# Patient Record
Sex: Male | Born: 1975 | Race: Asian | Hispanic: No | Marital: Married | Smoking: Never smoker
Health system: Southern US, Community
[De-identification: ages and names within clinical notes are randomized; demographics above are authoritative.]

---

## 2012-09-16 ENCOUNTER — Ambulatory Visit: Payer: Managed Care, Other (non HMO) | Admitting: Family Medicine

## 2012-09-16 VITALS — BP 90/72 | HR 74 | Temp 97.5°F | Resp 16 | Ht 65.0 in | Wt 150.0 lb

## 2012-09-16 DIAGNOSIS — R112 Nausea with vomiting, unspecified: Secondary | ICD-10-CM

## 2012-09-16 DIAGNOSIS — E860 Dehydration: Secondary | ICD-10-CM

## 2012-09-16 DIAGNOSIS — R197 Diarrhea, unspecified: Secondary | ICD-10-CM

## 2012-09-16 LAB — POCT CBC
Granulocyte percent: 92.8 %G — AB (ref 37–80)
HCT, POC: 48.1 % (ref 43.5–53.7)
Hemoglobin: 15.5 g/dL (ref 14.1–18.1)
Lymph, poc: 0.5 — AB (ref 0.6–3.4)
MCH, POC: 27.3 pg (ref 27–31.2)
MCHC: 32.2 g/dL (ref 31.8–35.4)
MCV: 84.7 fL (ref 80–97)
MID (cbc): 0.5 (ref 0–0.9)
MPV: 9.7 fL (ref 0–99.8)
POC Granulocyte: 12.9 — AB (ref 2–6.9)
POC LYMPH PERCENT: 3.6 %L — AB (ref 10–50)
POC MID %: 3.6 %M (ref 0–12)
Platelet Count, POC: 260 10*3/uL (ref 142–424)
RBC: 5.68 M/uL (ref 4.69–6.13)
RDW, POC: 14.1 %
WBC: 13.9 10*3/uL — AB (ref 4.6–10.2)

## 2012-09-16 MED ORDER — ONDANSETRON 4 MG PO TBDP
4.0000 mg | ORAL_TABLET | Freq: Once | ORAL | Status: AC
Start: 2012-09-16 — End: 2012-09-16
  Administered 2012-09-16: 4 mg via ORAL

## 2012-09-16 MED ORDER — PROMETHAZINE HCL 12.5 MG PO TABS: 12.5000 mg | ORAL_TABLET | Freq: Three times a day (TID) | ORAL | Status: DC | PRN

## 2012-09-16 NOTE — Patient Instructions (Signed)
Vim D? Dy Ru?t Do Vi Rt  (Viral Gastroenteritis) Vim d? dy ru?t do vi rt cn ???c g?i l cm d? dy. Tnh tr?ng ny ?nh h??ng ??n d? dy v ???ng ru?t. N c th? gy tiu ch?y v nn m?a ??t ng?t. B?nh th??ng ko di t? 3 ??n 8 ngy. H?u h?t m?i ng??i pht tri?n ph?n ?ng mi?n d?ch m cu?i cng thot kh?i vi rt. Trong khi ph?n ?ng t? nhin ny pht tri?n, vi rt c th? lm cho b?n r?t m?t.  NGUYN NHN  Nhi?u vi rt khc nhau c th? gy ra vim d? dy ru?t, ch?ng h?n nh? rotavirus ho?c norovirus. B?n c th? b? nhi?m m?t trong nh?ng lo?i vi rt ny do tiu th? th?c ph?m ho?c n??c b? nhi?m b?n. B?n c?ng c th? b? nhi?m vi rt do dng chung d?ng c? ho?c v?t d?ng c nhn khc v?i ng??i b? b?nh ho?c do ch?m vo b? m?t b? nhi?m b?n.  TRI?U CH?NG  Cc tri?u ch?ng ph? bi?n nh?t l tiu ch?y v nn m?a. Nh?ng v?n ?? ny c th? khi?n cho c? th? m?t n??c nghim tr?ng v c? th? m?t cn b?ng mu?i (ch?t ?i?n phn). Cc tri?u ch?ng khc c th? bao g?m:   S?t.  ?au ??u.  M?t m?i.  ?au b?ng. CH?N ?ON  Chuyn gia ch?m [place] y t? th??ng c th? ch?n ?on vim d? dy ru?t do vi rt d?a vo cc tri?u ch?ng v khm tr?c ti?p. M?u phn c?ng c th? ???c l?y ?? xt nghi?m xem c s? hi?n di?n c?a vi rt ho?c nhi?m trng khc khng.  ?I?U TR?  B?nh ny th??ng t? kh?i. ?i?u tr? nh?m m?c ?ch b n??c. Cc tr??ng h?p nghim tr?ng nh?t c?a vim d? dy ru?t do vi rt lin quan ??n nn m?a tr?m tr?ng khi?n b?n khng th? gi? ???c n??c trong c? th?. Trong nh?ng tr??ng h?p ny, ch?t l?ng ph?i ???c ??a vo c? th? thng qua m?t ???ng truy?n t?nh m?ch (IV).  H??NG D?N CH?M Ray T?I NH   U?ng ?? n??c ?? gi? cho n??c ti?u trong ho?c vng nh?t. U?ng m?t l??ng nh? ch?t l?ng th??ng xuyn v t?ng ln theo kh? n?ng dung n?p.  H?i chuyn gia ch?m [place] y t? ?? ???c h??ng d?n b n??c c? th?.  Trnh:  Th?c ph?m c hm l??ng ???ng cao.  R??u.  ?? u?ng c ga.  Thu?c l.  N??c p tri cy.  ?? u?ng c caffeine.  Ch?t l?ng c?c  nng ho?c l?nh.  Th?c ph?m bo, c nhi?u d?u.  ?n ho?c u?ng b?t c? th? g qu nhi?u m?t lc.  S?n ph?m t? s?a cho ??n 24 - 48 gi? sau khi d?ng tiu ch?y.  B?n c th? tiu th? cc ch? ph?m sinh h?c. Probiotics l s? nui c?y vi khu?n c l?i ch? ??ng. Chng c th? lm gi?m b?t l??ng phn v s? l?n tiu ch?y ? ng??i l?n. Probiotics c th? ???c tm th?y trong s?a chua v?i nh?ng nui c?y ch? ??ng v cc ch?t b? sung.  R?a tay k? ?? trnh ly lan vi rt.  Ch? s? d?ng thu?c mua tr?c ti?p t?i hi?u thu?c ho?c thu?c theo toa ?? gi?m ?au, gi?m s? kh ch?u ho?c h? s?t theo ch? d?n c?a chuyn gia ch?m [place] y t? c?a b?n. Khng cho tr? em s? d?ng atpirin. Khng nn dng thu?c ch?ng tiu ch?y.  Hy h?i chuyn gia ch?m [place]  y t? c?a b?n xem c nn ti?p t?c u?ng thu?c ???c k ??n thng th??ng ho?c thu?c mua tr?c ti?p t?i hi?u thu?c c?a b?n khng.  Gi? m?i cu?c h?n khm l?i theo ch? d?n c?a chuyn gia ch?m [place] y t?. HY NGAY L?P T?C THAM V?N V?I CHUYN GIA Y T? N?U:   B?n khng th? gi? ch?t l?ng trong ng??i.  B?n khng ?i ti?u t nh?t m?t l?n m?i 6 ??n 8 ti?ng.  B?n pht tri?n th? d?c.  B?n th?y c mu trong phn ho?c ch?t nn. Phn ho?c ch?t nn c th? trng nh? b c ph.  B?n b? ?au b?ng gia t?ng ho?c ?au t?p trung ? m?t vng nh? (c?c b?).  B?n b? nn m?a ho?c tiu ch?y dai d?ng.  B?n b? s?t.  B?nh nhn l tr? d??i 3 thng tu?i v b b? s?t.  B?nh nhn l tr? trn 3 thng tu?i v b b? s?t v c cc tri?u ch?ng ko di.  B?nh nhn l tr? trn 3 thng tu?i v b b? s?t v c cc tri?u ch?ng ??t nhin tr? nn t?i t? h?n.  B?nh nhn l m?t em b v b khng c n??c m?t khi khc. ??M B?O B?N:   Hi?u cc h??ng d?n ny.  S? theo di tnh tr?ng c?a mnh.  S? yu c?u tr? gip ngay l?p t?c n?u b?n c?m th?y khng kh?e ho?c tnh tr?ng tr? nn t?i h?n. Document Released: 08/27/2011 [facility] Patient Information 2013 [place], [place].

## 2012-09-16 NOTE — Progress Notes (Signed)
Subjective: Patient presents with nausea and vomiting that began last night. Patient stated he vomited throughout the night until 5 a.m.. No other medical problems.  Patient builds frames for a living. No history of ulcers.   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  He tried to go to work today, but was too weak and came home.  Wife brought patient in for evaluation  Objective  Pale, appears weak but is alert and cooperative with good eye contact BP 94/74 P 100 sitting BP 90/72 P 120 standing HEENT:  Unremarkable Chest:  Clear Heart: rapid without murmur or gallop Abdomen:  Active BS, soft, nontender with no HSM or mass Skin:  Some petechia on abdomen  Assessment:  Acute dehydration with gastroenteritis Results for orders placed in visit on 09/16/12  POCT CBC      Result Value Range   WBC 13.9 (*) 4.6 - 10.2 K/uL   Lymph, poc 0.5 (*) 0.6 - 3.4   POC LYMPH PERCENT 3.6 (*) 10 - 50 %L   MID (cbc) 0.5  0 - 0.9   POC MID % 3.6  0 - 12 %M   POC Granulocyte 12.9 (*) 2 - 6.9   Granulocyte percent 92.8 (*) 37 - 80 %G   RBC 5.68  4.69 - 6.13 M/uL   Hemoglobin 15.5  14.1 - 18.1 g/dL   HCT, POC 45.4  09.8 - 53.7 %   MCV 84.7  80 - 97 fL   MCH, POC 27.3  27 - 31.2 pg   MCHC 32.2  31.8 - 35.4 g/dL   RDW, POC 11.9     Platelet Count, POC 260  142 - 424 K/uL   MPV 9.7  0 - 99.8 fL    Plan:  IV fluids Nausea with vomiting - Plan: promethazine (PHENERGAN) 12.5 MG tablet, POCT CBC, ondansetron (ZOFRAN-ODT) disintegrating tablet 4 mg  Diarrhea - Plan: promethazine (PHENERGAN) 12.5 MG tablet, POCT CBC  Dehydration - Plan: promethazine (PHENERGAN) 12.5 MG tablet, POCT CBC

## 2015-01-19 ENCOUNTER — Other Ambulatory Visit (HOSPITAL_COMMUNITY): Payer: Self-pay

## 2015-01-19 ENCOUNTER — Telehealth: Payer: Self-pay

## 2015-01-19 ENCOUNTER — Emergency Department (HOSPITAL_COMMUNITY): Admission: EM | Admit: 2015-01-19 | Discharge: 2015-01-19 | Discharge status: Absent without leave | Payer: Self-pay

## 2015-01-19 ENCOUNTER — Ambulatory Visit (INDEPENDENT_AMBULATORY_CARE_PROVIDER_SITE_OTHER): Payer: Managed Care, Other (non HMO) | Admitting: Emergency Medicine

## 2015-01-19 ENCOUNTER — Ambulatory Visit (HOSPITAL_COMMUNITY)
Admission: RE | Admit: 2015-01-19 | Discharge: 2015-01-19 | Disposition: A | Discharge status: Home / Self Care | Payer: Managed Care, Other (non HMO) | Source: Ambulatory Visit | Attending: Emergency Medicine | Admitting: Emergency Medicine

## 2015-01-19 VITALS — BP 171/93 | HR 75 | Temp 97.4°F | Resp 18 | Ht 66.0 in | Wt 161.0 lb

## 2015-01-19 DIAGNOSIS — R51 Headache: Secondary | ICD-10-CM | POA: Diagnosis not present

## 2015-01-19 DIAGNOSIS — R519 Headache, unspecified: Secondary | ICD-10-CM

## 2015-01-19 DIAGNOSIS — R112 Nausea with vomiting, unspecified: Secondary | ICD-10-CM

## 2015-01-19 DIAGNOSIS — H53149 Visual discomfort, unspecified: Secondary | ICD-10-CM | POA: Insufficient documentation

## 2015-01-19 DIAGNOSIS — R42 Dizziness and giddiness: Secondary | ICD-10-CM

## 2015-01-19 DIAGNOSIS — R1110 Vomiting, unspecified: Secondary | ICD-10-CM | POA: Insufficient documentation

## 2015-01-19 LAB — POCT CBC
Granulocyte percent: 87.3 %G — AB (ref 37–80)
HEMATOCRIT: 44.8 % (ref 43.5–53.7)
Hemoglobin: 14.3 g/dL (ref 14.1–18.1)
Lymph, poc: 1.4 (ref 0.6–3.4)
MCH: 26 pg — AB (ref 27–31.2)
MCHC: 31.9 g/dL (ref 31.8–35.4)
MCV: 81.5 fL (ref 80–97)
MID (CBC): 0.7 (ref 0–0.9)
MPV: 7.6 fL (ref 0–99.8)
POC GRANULOCYTE: 14.5 — AB (ref 2–6.9)
POC LYMPH %: 8.6 % — AB (ref 10–50)
POC MID %: 4.1 % (ref 0–12)
Platelet Count, POC: 240 10*3/uL (ref 142–424)
RBC: 5.5 M/uL (ref 4.69–6.13)
RDW, POC: 14.4 %
WBC: 16.6 10*3/uL — AB (ref 4.6–10.2)

## 2015-01-19 LAB — POCT URINALYSIS DIPSTICK
BILIRUBIN UA: NEGATIVE
Glucose, UA: NEGATIVE
Ketones, UA: 15
LEUKOCYTES UA: NEGATIVE
NITRITE UA: NEGATIVE
RBC UA: NEGATIVE
SPEC GRAV UA: 1.025
Urobilinogen, UA: 0.2
pH, UA: 6.5

## 2015-01-19 LAB — POCT UA - MICROSCOPIC ONLY
CRYSTALS, UR, HPF, POC: NEGATIVE
Casts, Ur, LPF, POC: NEGATIVE
Yeast, UA: NEGATIVE

## 2015-01-19 MED ORDER — HYDROCODONE-ACETAMINOPHEN 5-325 MG PO TABS: 1.0000 | ORAL_TABLET | ORAL | Status: DC | PRN

## 2015-01-19 MED ORDER — ONDANSETRON 8 MG PO TBDP: 8.0000 mg | ORAL_TABLET | Freq: Three times a day (TID) | ORAL | Status: DC | PRN

## 2015-01-19 NOTE — Progress Notes (Signed)
Subjective:  Patient ID: Zachary Carey, male    DOB: Jul 17, 1975  Age: 39 y.o. MRN: [id number]  CC: Emesis; Chills; and Headache   HPI Zachary Carey presents  with sudden onset of a headache today he developed nausea and vomiting several hours ago and shortly after developed dizziness and headache. He describes the headache as worst in his life. He denies any other focal neurologic or visual symptoms. No history of injury or antecedent illness. He was brought by his family as he can't drive. He was forced to leave work due to symptoms. Has no fever or chills no cough coryza sore throat ear pain.  History Zachary Carey has no past medical history on file.   He has no past surgical history on file.   His  family history is not on file.  He   reports that he has never smoked. He does not have any smokeless tobacco history on file. He reports that he does not drink alcohol or use illicit drugs.  Outpatient Prescriptions Prior to Visit  Medication Sig Dispense Refill  . promethazine (PHENERGAN) 12.5 MG tablet Take 1 tablet (12.5 mg total) by mouth every 8 (eight) hours as needed for nausea. 20 tablet 0   No facility-administered medications prior to visit.    History   Social History  . Marital Status: Married    Spouse Name: N/A  . Number of Children: N/A  . Years of Education: N/A   Social History Main Topics  . Smoking status: Never Smoker   . Smokeless tobacco: Not on file  . Alcohol Use: No  . Drug Use: No  . Sexual Activity: Yes   Other Topics Concern  . None   Social History Narrative     Review of Systems  Constitutional: Positive for fever and chills. Negative for appetite change.  HENT: Negative for congestion, ear pain, postnasal drip, sinus pressure and sore throat.   Eyes: Negative for pain and redness.  Respiratory: Negative for cough, shortness of breath and wheezing.   Cardiovascular: Negative for leg swelling.  Gastrointestinal: Positive for nausea and  vomiting. Negative for abdominal pain, diarrhea, constipation and blood in stool.  Endocrine: Negative for polyuria.  Genitourinary: Negative for dysuria, urgency, frequency and flank pain.  Musculoskeletal: Negative for gait problem.  Skin: Negative for rash.  Neurological: Positive for dizziness and headaches. Negative for weakness.  Psychiatric/Behavioral: Negative for confusion and decreased concentration. The patient is not nervous/anxious.     Objective:  BP 171/93 mmHg  Pulse 75  Temp(Src) 97.4 F (36.3 C) (Oral)  Resp 18  Ht 5\' 6"  (1.676 m)  Wt 161 lb (73.029 kg)  BMI 26.00 kg/m2  SpO2 97%  Physical Exam  Constitutional: He is oriented to person, place, and time. He appears well-developed and well-nourished. He appears distressed.  HENT:  Head: Normocephalic and atraumatic.  Right Ear: External ear normal.  Left Ear: External ear normal.  Nose: Nose normal.  Mouth/Throat: Mucous membranes are dry.  Eyes: Conjunctivae and EOM are normal. Pupils are equal, round, and reactive to light. No scleral icterus.  Neck: Normal range of motion. Neck supple. No tracheal deviation present.  Cardiovascular: Normal rate, regular rhythm and normal heart sounds.   Pulmonary/Chest: Effort normal. No respiratory distress. He has no wheezes. He has no rales.  Abdominal: He exhibits no mass. There is no tenderness. There is no rebound and no guarding.  Musculoskeletal: He exhibits no edema.  Lymphadenopathy:    He has no cervical adenopathy.  Neurological: He is alert and oriented to person, place, and time. Coordination normal.  Skin: Skin is warm and dry. No rash noted.  Psychiatric: He has a normal mood and affect. His behavior is normal.      Assessment & Plan:   There are no diagnoses linked to this encounter. I have discontinued Mr. Wirthlin's promethazine.  No orders of the defined types were placed in this encounter.   His CT scan was reported negative by  radiologist.  Appropriate red flag conditions were discussed with the patient as well as actions that should be taken.  Patient expressed his understanding.  Follow-up: No Follow-up on file.  Carmelina Dane, MD

## 2015-01-19 NOTE — Telephone Encounter (Signed)
Left vm for [place] to cb for Rx info. Dr. Dareen Piano wrote an Rx for Norco 5-325 for his HA. Will put Rx in drawer.

## 2015-01-19 NOTE — Telephone Encounter (Signed)
Pt just came back from having a ct which they were told it was negative but now would like a rx for his headache    Best number [phone]

## 2015-01-19 NOTE — Patient Instructions (Signed)
Go to [facility] and register at the Emergency Department. Register as OUTPATIENT CT. DO NOT REGISTER AS ED PATIENT.

## 2015-07-06 ENCOUNTER — Ambulatory Visit (INDEPENDENT_AMBULATORY_CARE_PROVIDER_SITE_OTHER): Payer: Managed Care, Other (non HMO) | Admitting: Family Medicine

## 2015-07-06 VITALS — BP 126/84 | HR 75 | Temp 98.1°F | Resp 18 | Ht 66.0 in | Wt 158.8 lb

## 2015-07-06 DIAGNOSIS — Z131 Encounter for screening for diabetes mellitus: Secondary | ICD-10-CM

## 2015-07-06 DIAGNOSIS — Z13220 Encounter for screening for lipoid disorders: Secondary | ICD-10-CM | POA: Diagnosis not present

## 2015-07-06 DIAGNOSIS — Z23 Encounter for immunization: Secondary | ICD-10-CM

## 2015-07-06 DIAGNOSIS — H547 Unspecified visual loss: Secondary | ICD-10-CM | POA: Diagnosis not present

## 2015-07-06 DIAGNOSIS — Z0000 Encounter for general adult medical examination without abnormal findings: Secondary | ICD-10-CM | POA: Diagnosis not present

## 2015-07-06 LAB — COMPLETE METABOLIC PANEL WITH GFR
ALT: 28 U/L (ref 9–46)
AST: 22 U/L (ref 10–40)
Albumin: 4.4 g/dL (ref 3.6–5.1)
Alkaline Phosphatase: 38 U/L — ABNORMAL LOW (ref 40–115)
BILIRUBIN TOTAL: 0.6 mg/dL (ref 0.2–1.2)
BUN: 12 mg/dL (ref 7–25)
CHLORIDE: 103 mmol/L (ref 98–110)
CO2: 26 mmol/L (ref 20–31)
Calcium: 9.3 mg/dL (ref 8.6–10.3)
Creat: 1.04 mg/dL (ref 0.60–1.35)
GFR, EST NON AFRICAN AMERICAN: 89 mL/min (ref >=60)
Glucose, Bld: 91 mg/dL (ref 65–99)
Potassium: 4.1 mmol/L (ref 3.5–5.3)
Sodium: 138 mmol/L (ref 135–146)
Total Protein: 7.4 g/dL (ref 6.1–8.1)

## 2015-07-06 LAB — LIPID PANEL
Cholesterol: 294 mg/dL — ABNORMAL HIGH (ref 125–200)
HDL: 48 mg/dL (ref >=40)
LDL CALC: 202 mg/dL — AB (ref <130)
TRIGLYCERIDES: 218 mg/dL — AB (ref <150)
Total CHOL/HDL Ratio: 6.1 Ratio — ABNORMAL HIGH (ref <=5.0)
VLDL: 44 mg/dL — ABNORMAL HIGH (ref <30)

## 2015-07-06 NOTE — Progress Notes (Signed)
Subjective:    Patient ID: Zachary Carey, male    DOB: 04-Aug-1975, 40 y.o.   MRN: [id number] By signing my name below, I, Littie Deeds, attest that this documentation has been prepared under the direction and in the presence of Meredith Staggers, MD.  Electronically Signed: Littie Deeds, Medical Scribe. 07/06/2015. 9:40 AM.  HPI HPI Comments: Zachary Carey is a 40 y.o. male who presents to the [facility] for an annual physical exam with biometric screening form. He is a new patient to me, no PCP listed. He does not have any known medical problems and does not take any regular medications. Patient is fasting; he last ate around 10:00 PM last night. No FMHx of medical problems, including cancer, per patient.  Immunizations: He received the flu shot about 3 months ago. Patient is a [place] and came to the [place] in 2002. He states that he has not had any vaccinations other than the flu shot since then.  Depression screening:  Depression screen [facility] 2/9 07/06/2015 01/19/2015  Decreased Interest 0 0  Down, Depressed, Hopeless 0 0  PHQ - 2 Score 0 0   Vision screening: He wears protective glasses when he is at work, but does not wear any prescription lenses. He does not see an eye doctor regularly.  Visual Acuity Screening   Right eye Left eye Both eyes  Without correction:  With correction:       Dentist: He does not see a dentist regularly.  Exercise: No regular exercise.  He is happily married and has three children (18, 3, 9). Patient makes picture frames for a living.  There are no active problems to display for this patient.  History reviewed. No pertinent past medical history. History reviewed. No pertinent past surgical history. No Known Allergies Prior to Admission medications   Medication Sig Start Date End Date Taking? Authorizing Provider  HYDROcodone-acetaminophen (NORCO) 5-325 MG per tablet Take 1-2 tablets by mouth every 4 (four)  hours as needed. Patient not taking: Reported on 07/06/2015 01/19/15   Carmelina Dane, MD  ondansetron (ZOFRAN-ODT) 8 MG disintegrating tablet Take 1 tablet (8 mg total) by mouth every 8 (eight) hours as needed for nausea. Patient not taking: Reported on 07/06/2015 01/19/15   Carmelina Dane, MD   Social History   Social History  . Marital Status: Married    Spouse Name: N/A  . Number of Children: N/A  . Years of Education: N/A   Occupational History  . Not on file.   Social History Main Topics  . Smoking status: Never Smoker   . Smokeless tobacco: Not on file  . Alcohol Use: No  . Drug Use: No  . Sexual Activity: Yes   Other Topics Concern  . Not on file   Social History Narrative     Review of Systems 13 point ROS reviewed on patient health survey. No specific concerns today. Negative other than listed above  Objective:   Physical Exam  Constitutional: He is oriented to person, place, and time. He appears well-developed and well-nourished.  HENT:  Head: Normocephalic and atraumatic.  Right Ear: External ear normal.  Left Ear: External ear normal.  Mouth/Throat: Oropharynx is clear and moist.  Eyes: Conjunctivae and EOM are normal. Pupils are equal, round, and reactive to light.  Neck: Normal range of motion. Neck supple. No thyromegaly present.  Cardiovascular: Normal rate, regular rhythm, normal heart sounds and intact distal pulses.   Pulmonary/Chest:  Effort normal and breath sounds normal. No respiratory distress. He has no wheezes.  Abdominal: Soft. He exhibits no distension. There is no tenderness. Hernia confirmed negative in the right inguinal area and confirmed negative in the left inguinal area.  Musculoskeletal: Normal range of motion. He exhibits no edema or tenderness.  Lymphadenopathy:    He has no cervical adenopathy.  Neurological: He is alert and oriented to person, place, and time. He has normal reflexes.  Skin: Skin is warm and dry.  Psychiatric:  He has a normal mood and affect. His behavior is normal.  Vitals reviewed.     Filed Vitals:   07/06/15 0910  BP: 126/84  Pulse: 75  Temp: 98.1 F (36.7 C)  TempSrc: Oral  Resp: 18  Height:  (1.676 m)  Weight: 158 lb 12.8 oz (72.031 kg)  SpO2: 98%       Assessment & Plan:  Zachary Carey is a 40 y.o. male Annual physical exam  -anticipatory guidance as below in AVS, screening labs above. Health maintenance items as above in HPI discussed/recommended as applicable.   Screening for diabetes mellitus - Plan: COMPLETE METABOLIC PANEL WITH GFR  Need for Tdap vaccination - Plan: Tdap vaccine greater than or equal to 7yo IM given  Screening for hyperlipidemia - Plan: Lipid panel  Decreased visual acuity - Plan: Ambulatory referral to Ophthalmology for decreased R eye vision.   Understanding of above expressed. Form for work started, but will need lab results to complete.    No orders of the defined types were placed in this encounter.   Patient Instructions  You should receive a call or letter about your lab results within the next week to 10 days.   Tetanus given today.   We will refer you to eye doctor (right eye does not see well).   Keeping you healthy  Get these tests  Blood pressure- Have your blood pressure checked once a year by your healthcare provider.  Normal blood pressure is 120/80.  Weight- Have your body mass index (BMI) calculated to screen for obesity.  BMI is a measure of body fat based on height and weight. You can also calculate your own BMI at https://www.west-esparza.com/.  Cholesterol- Have your cholesterol checked regularly starting at age 57, sooner may be necessary if you have diabetes, high blood pressure, if a family member developed heart diseases at an early age or if you smoke.   Chlamydia, HIV, and other sexual transmitted disease- Get screened each year until the age of 91 then within three months of each new sexual partner.  Diabetes-  Have your blood sugar checked regularly if you have high blood pressure, high cholesterol, a family history of diabetes or if you are overweight.  Get these vaccines  Flu shot- Every fall.  Tetanus shot- Every 10 years.  Menactra- Single dose; prevents meningitis.  Take these steps  Don't smoke- If you do smoke, ask your healthcare provider about quitting. For tips on how to quit, go to www.smokefree.gov or call 1-800-QUIT-NOW.  Be physically active- Exercise 5 days a week for at least 30 minutes.  If you are not already physically active start slow and gradually work up to 30 minutes of moderate physical activity.  Examples of moderate activity include walking briskly, mowing the yard, dancing, swimming bicycling, etc.  Eat a healthy diet- Eat a variety of healthy foods such as fruits, vegetables, low fat milk, low fat cheese, yogurt, lean meats, poultry, fish, beans, tofu, etc.  For more information on healthy eating, go to www.thenutritionsource.org  Drink alcohol in moderation- Limit alcohol intake two drinks or less a day.  Never drink and drive.  Dentist- Brush and floss teeth twice daily; visit your dentis twice a year.  Depression-Your emotional health is as important as your physical health.  If you're feeling down, losing interest in things you normally enjoy please talk with your healthcare provider.  Gun Safety- If you keep a gun in your home, keep it unloaded and with the safety lock on.  Bullets should be stored separately.  Helmet use- Always wear a helmet when riding a motorcycle, bicycle, rollerblading or skateboarding.  Safe sex- If you may be exposed to a sexually transmitted infection, use a condom  Seat belts- Seat bels can save your life; always wear one.  Smoke/Carbon Monoxide detectors- These detectors need to be installed on the appropriate level of your home.  Replace batteries at least once a year.  Skin Cancer- When out in the sun, cover up and use  sunscreen SPF 15 or higher.  Violence- If anyone is threatening or hurting you, please tell your healthcare provider.    I personally performed the services described in this documentation, which was scribed in my presence. The recorded information has been reviewed and considered, and addended by me as needed.

## 2015-07-06 NOTE — Patient Instructions (Signed)
You should receive a call or letter about your lab results within the next week to 10 days.   Tetanus given today.   We will refer you to eye doctor (right eye does not see well).   Keeping you healthy  Get these tests  Blood pressure- Have your blood pressure checked once a year by your healthcare provider.  Normal blood pressure is 120/80.  Weight- Have your body mass index (BMI) calculated to screen for obesity.  BMI is a measure of body fat based on height and weight. You can also calculate your own BMI at https://www.west-esparza.com/.  Cholesterol- Have your cholesterol checked regularly starting at age 48, sooner may be necessary if you have diabetes, high blood pressure, if a family member developed heart diseases at an early age or if you smoke.   Chlamydia, HIV, and other sexual transmitted disease- Get screened each year until the age of 49 then within three months of each new sexual partner.  Diabetes- Have your blood sugar checked regularly if you have high blood pressure, high cholesterol, a family history of diabetes or if you are overweight.  Get these vaccines  Flu shot- Every fall.  Tetanus shot- Every 10 years.  Menactra- Single dose; prevents meningitis.  Take these steps  Don't smoke- If you do smoke, ask your healthcare provider about quitting. For tips on how to quit, go to www.smokefree.gov or call 1-800-QUIT-NOW.  Be physically active- Exercise 5 days a week for at least 30 minutes.  If you are not already physically active start slow and gradually work up to 30 minutes of moderate physical activity.  Examples of moderate activity include walking briskly, mowing the yard, dancing, swimming bicycling, etc.  Eat a healthy diet- Eat a variety of healthy foods such as fruits, vegetables, low fat milk, low fat cheese, yogurt, lean meats, poultry, fish, beans, tofu, etc.  For more information on healthy eating, go to www.thenutritionsource.org  Drink alcohol in  moderation- Limit alcohol intake two drinks or less a day.  Never drink and drive.  Dentist- Brush and floss teeth twice daily; visit your dentis twice a year.  Depression-Your emotional health is as important as your physical health.  If you're feeling down, losing interest in things you normally enjoy please talk with your healthcare provider.  Gun Safety- If you keep a gun in your home, keep it unloaded and with the safety lock on.  Bullets should be stored separately.  Helmet use- Always wear a helmet when riding a motorcycle, bicycle, rollerblading or skateboarding.  Safe sex- If you may be exposed to a sexually transmitted infection, use a condom  Seat belts- Seat bels can save your life; always wear one.  Smoke/Carbon Monoxide detectors- These detectors need to be installed on the appropriate level of your home.  Replace batteries at least once a year.  Skin Cancer- When out in the sun, cover up and use sunscreen SPF 15 or higher.  Violence- If anyone is threatening or hurting you, please tell your healthcare provider.

## 2016-09-19 ENCOUNTER — Ambulatory Visit (INDEPENDENT_AMBULATORY_CARE_PROVIDER_SITE_OTHER): Payer: Managed Care, Other (non HMO) | Admitting: Physician Assistant

## 2016-09-19 VITALS — BP 126/80 | HR 69 | Temp 98.3°F | Resp 16 | Ht 65.0 in | Wt 156.4 lb

## 2016-09-19 DIAGNOSIS — Z131 Encounter for screening for diabetes mellitus: Secondary | ICD-10-CM | POA: Diagnosis not present

## 2016-09-19 DIAGNOSIS — Z0000 Encounter for general adult medical examination without abnormal findings: Secondary | ICD-10-CM | POA: Diagnosis not present

## 2016-09-19 DIAGNOSIS — E785 Hyperlipidemia, unspecified: Secondary | ICD-10-CM

## 2016-09-19 DIAGNOSIS — Z0189 Encounter for other specified special examinations: Secondary | ICD-10-CM

## 2016-09-19 DIAGNOSIS — H547 Unspecified visual loss: Secondary | ICD-10-CM

## 2016-09-19 DIAGNOSIS — Z13220 Encounter for screening for lipoid disorders: Secondary | ICD-10-CM | POA: Diagnosis not present

## 2016-09-19 DIAGNOSIS — R030 Elevated blood-pressure reading, without diagnosis of hypertension: Secondary | ICD-10-CM | POA: Diagnosis not present

## 2016-09-19 DIAGNOSIS — Z008 Encounter for other general examination: Secondary | ICD-10-CM

## 2016-09-19 NOTE — Patient Instructions (Addendum)
Last year your cholesterol level was high. Please see below information about cholesterol. You can help correct this through the foods you eat and getting regular exercise.  If your labs are still high this year, we will start you on a medication to lower it.  We will call you when your paperwork is ready.   The eye doctor will call you to set up an appointment to check your vision. Please make the appointment, as your vision is getting worse.   Thank you for coming in today. I hope you feel we met your needs.  Feel free to call [facility] if you have any questions or further requests.  Please consider signing up for MyChart if you do not already have it, as this is a great way to communicate with me.  Best,  Whitney McVey, PA-C    Cholesterol Cholesterol is a fat. Your body needs a small amount of cholesterol. Cholesterol (plaque) may build up in your blood vessels (arteries). That makes you more likely to have a heart attack or stroke. You cannot feel your cholesterol level. Having a blood test is the only way to find out if your level is high. Keep your test results. Work with your doctor to keep your cholesterol at a good level. What do the results mean?  Total cholesterol is how much cholesterol is in your blood.  LDL is bad cholesterol. This is the type that can build up. Try to have low LDL.  HDL is good cholesterol. It cleans your blood vessels and carries LDL away. Try to have high HDL.  Triglycerides are fat that the body can store or burn for energy. What are good levels of cholesterol?  Total cholesterol below 200.  LDL below 100 is good for people who have health risks. LDL below 70 is good for people who have very high risks.  HDL above 40 is good. It is best to have HDL of 60 or higher.  Triglycerides below 150. How can I lower my cholesterol? Diet  Follow your diet program as told by your doctor.  Choose fish, white meat chicken, or [place] that is roasted or baked.  Try not to eat red meat, fried foods, sausage, or lunch meats.  Eat lots of fresh fruits and vegetables.  Choose whole grains, beans, pasta, potatoes, and cereals.  Choose olive oil, corn oil, or canola oil. Only use small amounts.  Try not to eat butter, mayonnaise, shortening, or palm kernel oils.  Try not to eat foods with trans fats.  Choose low-fat or nonfat dairy foods.  Drink skim or nonfat milk.  Eat low-fat or nonfat yogurt and cheeses.  Try not to drink whole milk or cream.  Try not to eat ice cream, egg yolks, or full-fat cheeses.  Healthy desserts include angel food cake, ginger snaps, animal crackers, hard candy, popsicles, and low-fat or nonfat frozen yogurt. Try not to eat pastries, cakes, pies, and cookies. Exercise  Follow your exercise program as told by your doctor.  Be more active. Try gardening, walking, and taking the stairs.  Ask your doctor about ways that you can be more active. Medicine  Take over-the-counter and prescription medicines only as told by your doctor. This information is not intended to replace advice given to you by your health care provider. Make sure you discuss any questions you have with your health care provider. Document Released: 08/31/2008 Document Revised: 01/04/2016 Document Reviewed: 12/15/2015 Elsevier Interactive Patient Education  2017 [place]? ?? ?  n h?n ch? ch?t be?o va? cholesterol Fat and Cholesterol Restricted Diet N?ng ?? ch?t bo v cholesterol cao trong mu cu?a quy? vi? c th? d?n ??n cc v?n ?? s?c kh?e khc nhau, ch?ng h?n nh? cc b?nh v? tim, m?ch mu, ti m?t, gan v tuy?n t?y. Ch?t bo l ca?c ngu?n n?ng l??ng t?p trung t?n t?i ? nhi?u d?ng khc nhau. M?t s? lo?i ch?t bo nh?t ??nh, bao g?m ch?t bo bo ha, c th? gy h?i khi th??a. Cholesterol l m?t ch?t m c? th? c?n ??n v?i m?t l??ng nh?. C? th? c?a quy? vi? ta?o ra t?t c? cholesterol c?n thi?t. Cholesterol d? th?a do th?c ph?m quy? vi? ?n. Khi  quy? vi? c n?ng ?? cholesterol v ch?t bo bo ha cao trong mu, cc v?n ?? s?c kh?e c th? pht sinh v ch?t bo v cholesterol d? th??a s? ti?ch tu? d?c theo tha?nh cc m?ch mu, khi?n cc m?ch mu ? bi? he?p la?i. L?a ch?n ?ng lo?i th?c ph?m s? gip quy? vi? ki?m sot l??ng ch?t bo v cholesterol ?n va?o. ?i?u ny s? gip gi? cho n?ng ?? cc ch?t na?y trong mu c?a quy? vi? n?m trong gi?i h?n bnh th??ng v lm gi?m nguy c? m?c b?nh. K? ho?ch c?a ti l g? Chuyn gia ch?m sc s?c kh?e c th? khuy?n ngh? quy? vi?:  H?n ch? l??ng ch?t bo tiu th? ?? m??c t? ______% tr? xu?ng theo t?ng l??ng calo m?i ngy.  H?n ch? l??ng cholesterol trong ch? ?? ?n c?a quy? vi? ?? m??c d??i _________mg m?i ngy.  ?n t? 20-30 gam ch?t x? m?i ngy. Ti nn ch?n nh?ng lo?i ch?t bo no?  Ch?n cc ch?t bo t?t cho s?c kh?e th??ng xuyn h?n. Ch?n ch?t bo khng bo ha ??n v ch?t be?o khng ba?o ho?a ?a, ch?ng h?n nh? d?u  liu v d?u canola, h?t lanh, qu? c ch, h?nh nhn v cc lo?i h?t.  ?n thm ch?t bo omega-3. Nh??ng l?a ch?n h??p ly? bao g?m c h?i, c thu, c mi, c ng?, d?u h?t lanh va? h?t lanh nghi?n. ???t mu?c tiu ?n c t nh?t hai l?n m?t tu?n.  H?n ch? cc ch?t bo bo ha. Ch?t bo bo ha ch? y?u ???c tm th?y trong cc s?n ph?m ??ng v?t, nh? th?t, b? v kem. Ca?c ngu?n ch?t bo bo ha t?? th??c v?t bao g?m d?u c?, d?u h?t c? v d?u d?a.  Trnh cc th?c ph?m co? ch??a cc lo?i d?u hydro ha m?t ph?n. Nh?ng th?c ph?m ny ch?a ch?t bo chuy?n ha. V d? v? th?c ph?m ch?a ch?t bo chuy?n ho?a l b? th?c v?t, m?t s? b? th?c v?t ?o?ng h?p, bnh quy, bnh quy gin v bnh n??ng khc. Nh?ng nguyn t?c chung ti c?n tun theo l g? Nh?ng h??ng d?n cho vi?c ?n u?ng lnh m?nh na?y s? gip quy? vi? ki?m sot l??ng ch?t be?o v cholesterol ?n va?o:  Ki?m tra nhn th?c ph?m c?n th?n ?? nh?n bi?t th?c ph?m c ch?a ch?t bo chuy?n ha ho?c c hm l??ng ch?t bo bo ha cao.  Cho rau v sa lt rau  xanh vo m?t n?a ??a ??ng th?c ?n.  Cho ngu? c?c nguyn ca?m va?o m?t ph?n t? ??a. Hy tm t?? "whole" (nguyn ca?m) l t? ??u tin trong danh sch thnh ph?n th?c ?n.  Cho th?c ?n c protein thi?t na?c va?o m?t ph?n t? ??a.  H?n ch? tri cy ?? m??c hai   b?a m?t ngy. Ch?n tri cy thay v n??c tri cy.  ?n nhi?u th?c ph?m c ch?a ch?t x? nh? to, bng c?i xanh, c r?t, ??u, ??u H-Lan v ??i m?ch.  ?n nhi?u th?c ?n n?u t?i nh v i?t th??c ?n ?? nh hng, th??c ?n t? ch?n v th?c ?n nhanh.  H?n ch? ho?c trnh u?ng r??u.  Ha?n ch? th?c ph?m co? nhi?u tinh b?t v ???ng.  H?n ch? th?c ph?m chin.  N?u ?n b?ng cc ph??ng php khc thay vi? chin. Bo? lo?, lu?c, n??ng v hun ??u l nh??ng l?a ch?n tuy?t v?i.  Gi?m cn n?u qu v? th?a cn. Gia?m ch? 5-10% cn n?ng c? th? ban ??u c th? gip i?ch cho s?c kh?e t?ng th? c?a quy? vi? v ng?n ng?a cc b?nh nh? ti?u ???ng v b?nh tim. Ti c th? ?n nh?ng th?c ph?m no? Ng? c?c nguyn h?t   Ng? c?c nguyn ca?m nh? la m nguyn ca?m ho??c ho?c bnh m nguyn ca?m, bnh quy gin, ng? c?c v m ?ng. B?t y?n m?ch khng ???ng, mi? bulgur, la m?ch, dim ma?ch (quinoa) ho?c g?o l??t. Ng ho?c bnh b?t m nguyn ca?m. Marlou Starks c?   Marlou Starks c? t??i ho?c ?ng l?nh (ch?a ch? bi?n, h?p, bo? lo? ho?c n??ng). Toney Reil tr?n. Tri cy   T?t c? cc lo?i tra?i cy t??i, ?ng h?p (d???i da?ng n??c p t? nhin) ho?c ?ng l?nh. Th?t v nh?ng ngu?n th?c ph?m ch?a protein khc   Th?t b xay (85% ho?c na?c h?n), thi?t bo? ?n c? ho?c th?t b c?t bo? m??. Ga? ho??c ga? ty bo? da. G ho?c g ty xay. Th?t l??n c??t bo? m??. T?t c? c v h?i s?n. Tr?ng. Cc lo?i ??u, ??u H Lan ho??c ??u l?ng s?y kh. Cc lo?i qu? h?ch v h?t khng ??p mu?i. ??u ?ng h?p ho??c ??u s?y kh khng ??p mu?i. B? s?a   Cc s?n ph?m t? s?a t bo nh? s?a g?y ho?c s?a 1%, 2% ho?c pho mt t bo, ricotta i?t bo ho?c pho mt t??i ho?c s?a chua t bo. M? v d?u   B? th?c v?t ?ng h?p  khng c ch?t bo chuy?n ha. Mayonnaise v n???c tr?n rau tr?n nh? ho??c i?t ch?t be?o. Qu? b?. D?u  liu, d?u canola, d?u m ho??c d?u hoa rum. B? ??u ph?ng ho?c h?nh nhn thin nhin (ch?n nh?ng loa?i khng co? thm ???ng v d?u). Nh?ng th?c ph?m li?t k ? trn c th? khng ph?i l m?t danh m?c ??y ?? cc th?c ph?m v ?? u?ng ???c khuy?n ngh?Augustin Coupe h? v?i chuyn gia dinh d??ng ?? c thm s? l?a ch?n.  Cc th?c ph?m c?n trnh Ng? c?c nguyn h?t   Bnh m tr?ng. M tr?ng. G?o tr?ng. Bnh m ng. Bnh m trn, bnh ng?t v bnh s?ng b. Bnh quy gin ch?a ch?t bo chuy?n ha. Marlou Starks c?   Khoai ty tr?ng. Howie Ill tr?n kem ho??c rau xa?o. Cc lo?i rau tr?n n??c s?t pho mt. Tri cy   Tri cy s?y kh. Tri cy ?ng h?p ngm xi-r loa?ng ho??c ???c. N??c p tri cy. Th?t v nh?ng ngu?n th?c ph?m ch?a protein khc   La?t thi?t m??. X??ng s??n, cnh g, th?t xng khi, xc xch, xc xch hun khi, xc xch Y?, lo?ng l??n, m? l?ng l??n, xc xch hot dog, xc xi?ch ?? ra?n v th?t ?n tr?a ?ng gi. Gan v thi?t n?i ta?ng. B? s?a   S??a  nguyn kem ho??c 2%, kem, h?n h??p s??a nguyn kem va? kem t??i v pho mt kem. Pho mt s?a nguyn kem. S??a chua nguyn ch?t bo ho?c s?a chua co? ????ng. Pho mt nguyn ch?t bo. B?t kem khng s??a v l??p phu? kem ?a? ?a?nh bng. Pho mt ch? bi?n, ph?t pho mt, ho??c s??a ?ng pho mt. ?? u?ng   R??u. ?? u?ng c ???ng (nh? soda, n??c chanh v n??c tri cy ho?c r???u pn). M? v d?u   B?, b? th?c v?t da?ng tho?i, m? l?n, m?? pha ba?nh, b? s?a tru l?ng ho?c ch?t bo th?t xng khi. D?u d?a, h?t c?, ho?c c?. K?o v ?? trng mi?ng   Xi-r ng, ???ng, m?t ong v m?t ????ng. K?o. M??t v th?ch. Xi r. Ngu? c?c co? ????ng. Ba?nh quy, bnh n??ng, bnh ng?t, bnh rn, bnh n??ng x?p v kem. Nh?ng th?c ph?m li?t k ? trn c th? khng ph?i l m?t danh m?c ??y ?? cc th?c ph?m v ?? u?ng c?n trnh. Lin h? v?i chuyn gia dinh d??ng ?? c thm thng tin.  Thng  tin ny khng nh?m m?c ?ch thay th? cho l?i khuyn m chuyn gia ch?m [place] s?c kh?e ni v?i qu v?. Hy b?o ??m qu v? ph?i th?o lu?n b?t k? v?n ?? g m qu v? c v?i chuyn gia ch?m [place] s?c kh?e c?a qu v?. Document Released: 09/26/2015 Document Revised: 06/22/2016 Document Reviewed: 09/02/2013 Elsevier Interactive Patient Education  2017 [place].   IF you received an x-ray today, you will receive an invoice from [facility]. Please contact [facility] at [phone] with questions or concerns regarding your invoice.   IF you received labwork today, you will receive an invoice from [facility]. Please contact [facility] at [phone] with questions or concerns regarding your invoice.   Our billing staff will not be able to assist you with questions regarding bills from these companies.  You will be contacted with the lab results as soon as they are available. The fastest way to get your results is to activate your My Chart account. Instructions are located on the last page of this paperwork. If you have not heard from [place] regarding the results in 2 weeks, please contact this office.

## 2016-09-19 NOTE — Progress Notes (Signed)
Primary Care at [facility] [address] [place] [address] [phone]  Date:  09/19/2016   Name:  Zachary Carey   DOB:  [birth year]   MRN:  [id number]  PCP:  No PCP Per Patient    Chief Complaint: Annual Exam (biometric screening )   History of Present Illness:  This is a 41 y.o. male with no reported PMH who is presenting for CPE. He does not take any medications.  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 He is here today with his wife.  [place] is [place] 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   He is fasting - last meal was last night around 7pm His last CPE was 07/06/2015.  Last lipid panel 07/06/2015 elevated. As per Dr. Neva Seat result note: Advised initial attempts at diet change and exercise, avoiding fried/fatty foods, and recheck levels in the next 2-3 months. Today, pt report not making any lifestyle changes.   Complaints:  none Immunizations: UTD. Tdap 2017 Dentist: 2x/year. Brushes twice /day Eye: Was referred to opthamologist at last CPE due to poor vision screen: R20/50, L 20/20.  He did not f/u and did not go to appt.  Diet: rice, vegetables, meat. Eats fast food 1x/week.  Exercise: none Fam hx: none, per pt.  Sexual hx: Active - married with 3 children.  Tobacco/alcohol/substance use: Non smoker, no alcohol or drug use,    Review of Systems:  Review of Systems  Constitutional: Negative for activity change, appetite change and fatigue.  HENT: Negative for congestion, dental problem, sneezing and tinnitus.   Eyes: Negative for visual disturbance.  Respiratory: Negative for cough, chest tightness, shortness of breath and wheezing.   Cardiovascular: Negative for chest pain, palpitations and leg swelling.  Gastrointestinal: Negative for abdominal pain, blood in stool, constipation, diarrhea, nausea and vomiting.  Endocrine: Negative for polydipsia, polyphagia and polyuria.  Genitourinary: Negative for decreased urine volume, difficulty  urinating, discharge, hematuria, scrotal swelling and testicular pain.  Musculoskeletal: Negative for arthralgias, back pain and neck stiffness.  Allergic/Immunologic: Negative for environmental allergies and food allergies.  Neurological: Negative for dizziness, syncope, weakness, light-headedness and headaches.  Psychiatric/Behavioral: Negative for sleep disturbance. The patient is not nervous/anxious.     There are no active problems to display for this patient.   Prior to Admission medications   Not on File    No Known Allergies  History reviewed. No pertinent surgical history.  Social History  Substance Use Topics  . Smoking status: Never Smoker  . Smokeless tobacco: Never Used  . Alcohol use No    History reviewed. No pertinent family history.  Medication list has been reviewed and updated.  Physical Examination:  Physical Exam  Constitutional: He is oriented to person, place, and time. He appears well-developed and well-nourished. No distress.  HENT:  Head: Normocephalic and atraumatic.  Right Ear: External ear normal.  Left Ear: External ear normal.  Nose: Nose normal.  Mouth/Throat: No oropharyngeal exudate.  Eyes: Conjunctivae and EOM are normal. Pupils are equal, round, and reactive to light.  Neck: Normal range of motion. No thyromegaly present.  Cardiovascular: Normal rate, regular rhythm, normal heart sounds and intact distal pulses.   No murmur heard. Pulmonary/Chest: Effort normal and breath sounds normal. No respiratory distress. He has no wheezes.  Abdominal: Soft. Bowel sounds are normal. He exhibits no distension and no mass. There is no tenderness.  Musculoskeletal: Normal range of motion. He exhibits no edema.  Lymphadenopathy:    He  has no cervical adenopathy.  Neurological: He is alert and oriented to person, place, and time. He has normal reflexes.  Skin: Skin is warm and dry.  Psychiatric: He has a normal mood and affect. His behavior is  normal. Judgment and thought content normal.  Vitals reviewed.   BP (!) 144/82 (BP Location: Right Arm, Patient Position: Sitting, Cuff Size: Small)   Pulse 74   Temp 98.3 F (36.8 C) (Oral)   Resp 16   Ht  (1.651 m)   Wt 156 lb 6.4 oz (70.9 kg)   SpO2 96%   BMI 26.03 kg/m    Assessment and Plan: 1. Annual physical exam 2. Encounter for biometric screening 3. Elevated blood pressure reading - Recheck vitals - Improved after second reading.  - pt presents for biometric screening. Last year his lipids were elevated, he made no lifestyle changes. Discussed with pt need for improvements in diet and exercise. Labs are pending, will contact with results. Will more than likely start statin therapy. He understands.   4. Screening, lipid - Lipid panel  5. Screening for diabetes mellitus - Basic Metabolic Panel  6. Worsening vision - Ambulatory referral to Ophthalmology - Pt was referred to opthalmology at his last OV - he did not go to the appt. Discussed with pt his worsening visual acuity and need for evaluation and treatment. He agrees his vision has worsened.   Marco Collie, PA-C  Primary Care at [facility] 09/19/2016 9:16 AM

## 2016-09-20 ENCOUNTER — Encounter: Payer: Self-pay | Admitting: Physician Assistant

## 2016-09-20 LAB — BASIC METABOLIC PANEL WITH GFR
GFR calc Af Amer: 115 mL/min/{1.73_m2} (ref >59)
GFR calc non Af Amer: 100 mL/min/{1.73_m2} (ref >59)
Potassium: 4.2 mmol/L (ref 3.5–5.2)
Sodium: 139 mmol/L (ref 134–144)

## 2016-09-20 LAB — BASIC METABOLIC PANEL
BUN/Creatinine Ratio: 15 (ref 9–20)
BUN: 14 mg/dL (ref 6–24)
CO2: 23 mmol/L (ref 18–29)
Calcium: 9.1 mg/dL (ref 8.7–10.2)
Chloride: 101 mmol/L (ref 96–106)
Creatinine, Ser: 0.94 mg/dL (ref 0.76–1.27)
Glucose: 88 mg/dL (ref 65–99)

## 2016-09-20 LAB — LIPID PANEL
Chol/HDL Ratio: 5.9 ratio — ABNORMAL HIGH (ref 0.0–5.0)
Cholesterol, Total: 281 mg/dL — ABNORMAL HIGH (ref 100–199)
HDL: 48 mg/dL (ref >39)
LDL Calculated: 183 mg/dL — ABNORMAL HIGH (ref 0–99)
Triglycerides: 252 mg/dL — ABNORMAL HIGH (ref 0–149)
VLDL Cholesterol Cal: 50 mg/dL — ABNORMAL HIGH (ref 5–40)

## 2016-09-20 MED ORDER — ATORVASTATIN CALCIUM 20 MG PO TABS: 20.0000 mg | ORAL_TABLET | Freq: Every day | ORAL | 3 refills | Status: DC

## 2016-09-20 NOTE — Progress Notes (Signed)
Please call pt and let him know he can pick up his biometric form at the front desk. Thank you!

## 2016-09-20 NOTE — Addendum Note (Signed)
Addended by: Sebastian Ache on: 09/20/2016 08:24 AM   Modules accepted: Orders

## 2016-09-20 NOTE — Progress Notes (Unsigned)
Lipids con't to rise, will start on moderate-intensity statin as pt has no other risk factors. Will encourage lifestyle modifications as well. F/u in 3 months for recheck.

## 2017-02-11 IMAGING — CT CT HEAD W/O CM
2 series · 17 of 30 positions shown, 20 images · non-contrast
Comparison: None.

CLINICAL DATA: Severe headache. Photophobia. Vomiting and
dizziness. Migraine headache.

EXAM:
CT HEAD WITHOUT CONTRAST
TECHNIQUE: Contiguous axial images were obtained from the base of the skull
through the vertex without intravenous contrast.

[Series 2: head w/o · axial · non-contrast · 0.45mm/px · z∈[-97,+28]mm · 9 of 33 slices shown, 12 images]
[im 4/33  brain]
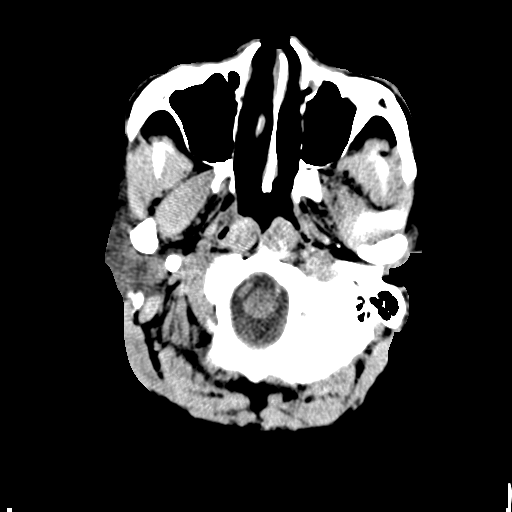
[im 4/33  bone]
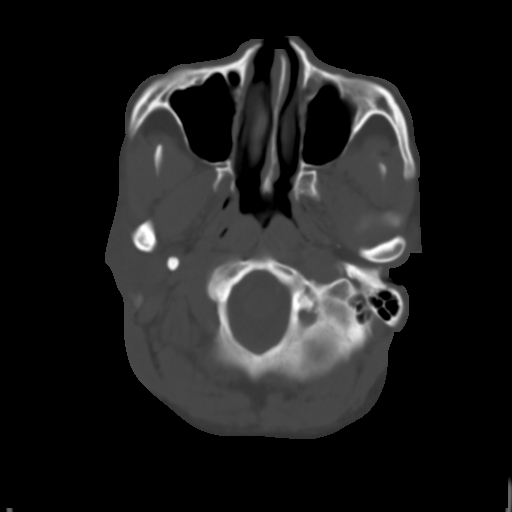
[im 7/33  brain]
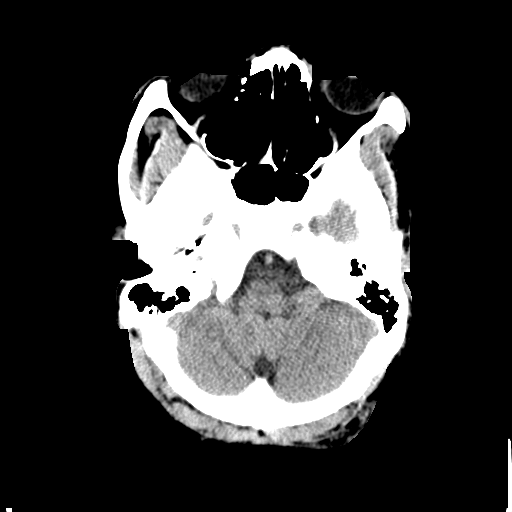
[im 10/33  brain]
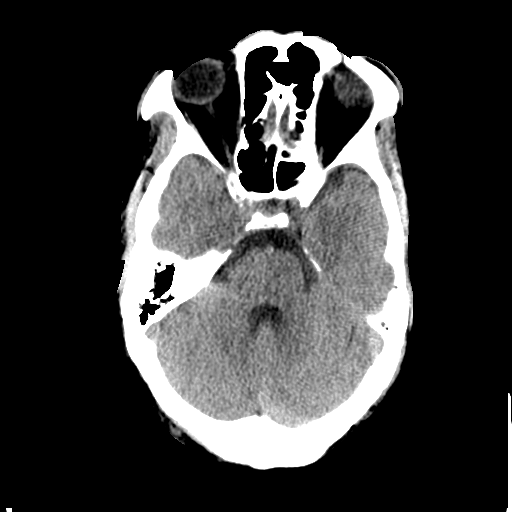
[im 13/33  brain]
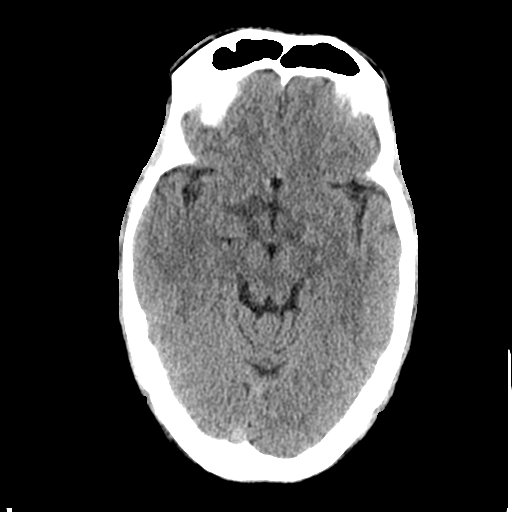
[im 17/33  brain]
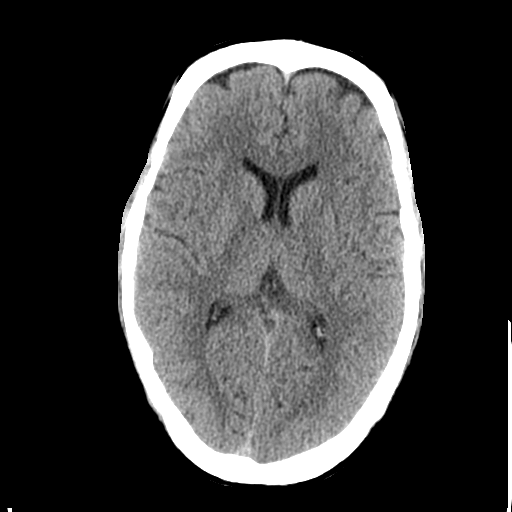
[im 17/33  bone]
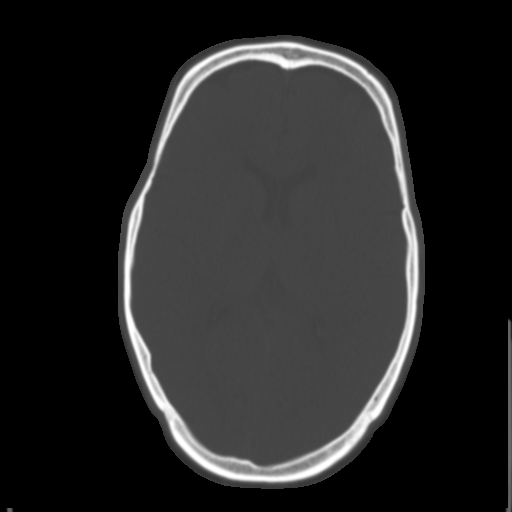
[im 20/33  brain]
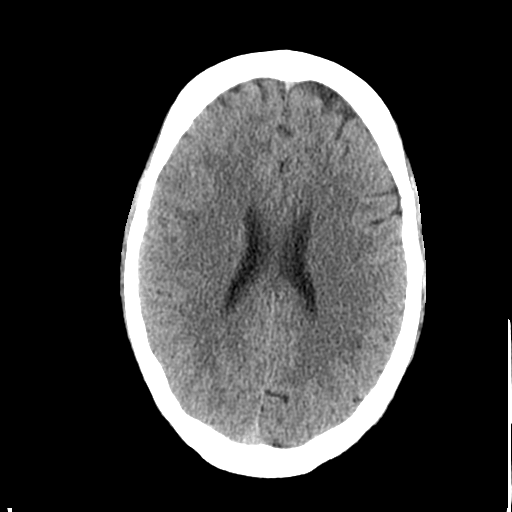
[im 23/33  brain]
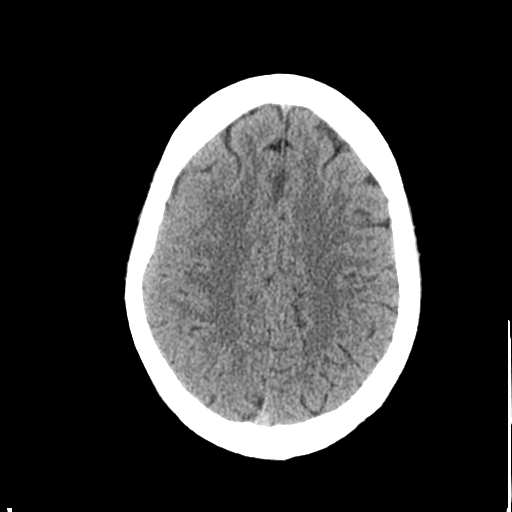
[im 26/33  brain]
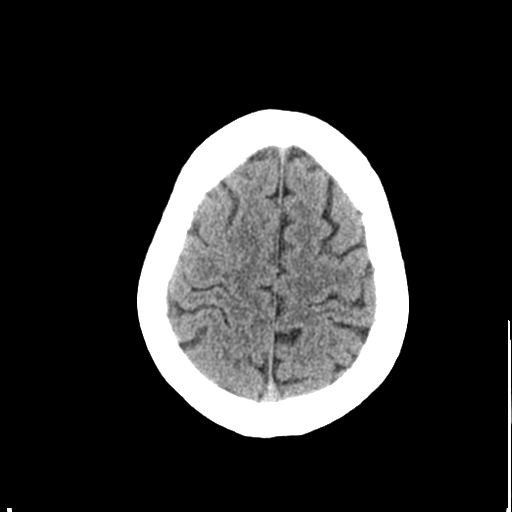
[im 29/33  brain]
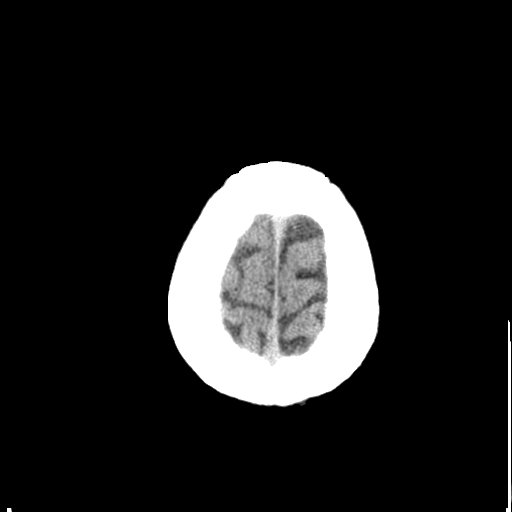
[im 29/33  bone]
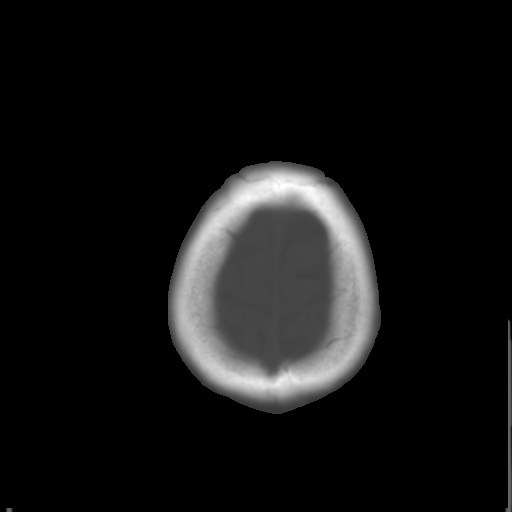

[Series 3: bone windows · axial · 0.45mm/px · z∈[-94,+32]mm · 8 of 55 slices shown]
[im 7/55  bone]
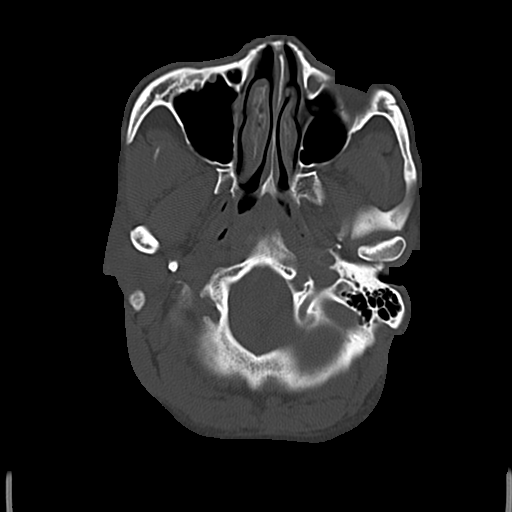
[im 13/55  bone]
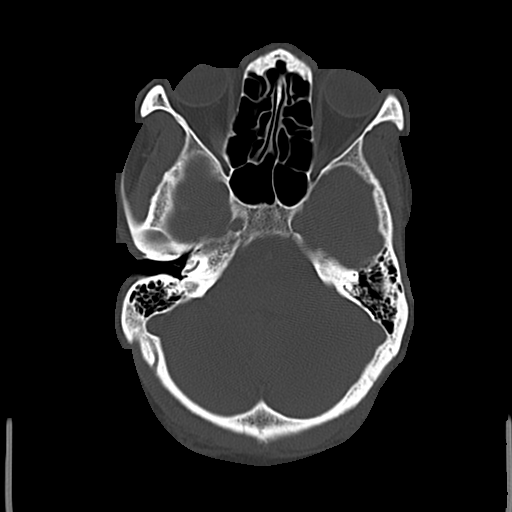
[im 19/55  bone]
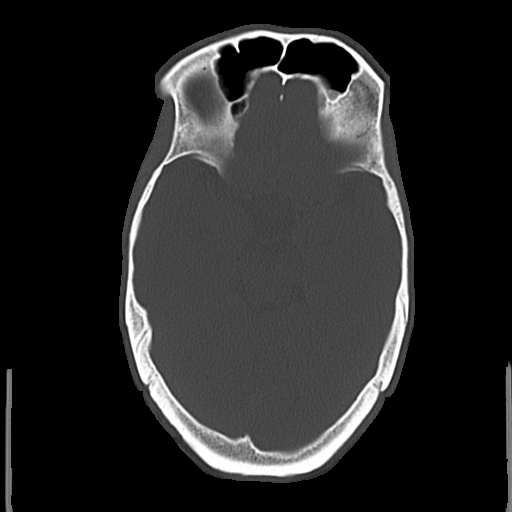
[im 25/55  bone]
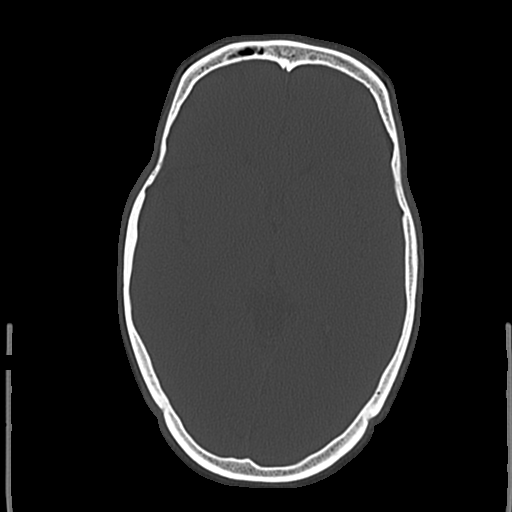
[im 31/55  bone]
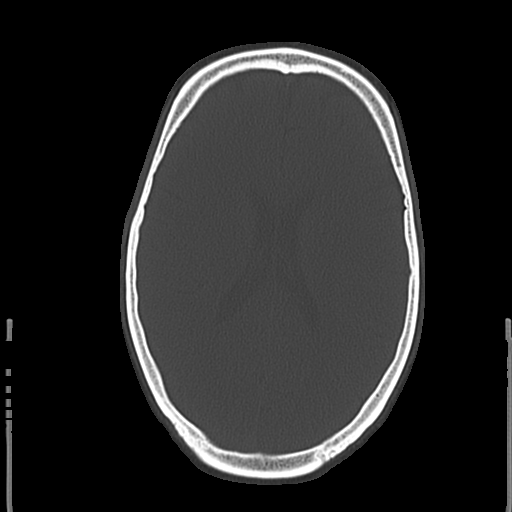
[im 37/55  bone]
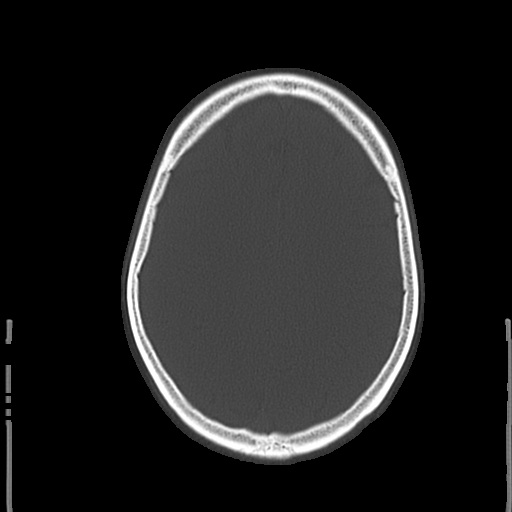
[im 43/55  bone]
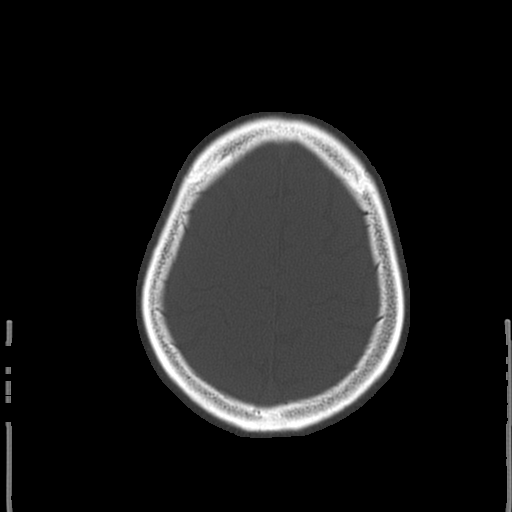
[im 49/55  bone]
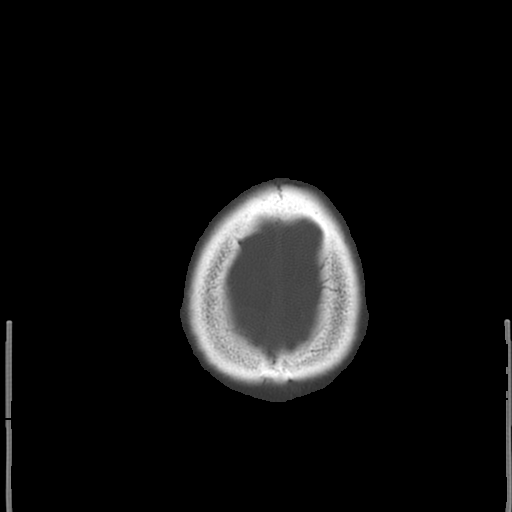

[17 of 30 positions shown; findings below may reference images not displayed]

FINDINGS: No mass lesion, mass effect, midline shift, hydrocephalus,
hemorrhage. No territorial ischemia or acute infarction. The visible
paranasal sinuses appear normal. Calcification is present in the
RIGHT cheek.
IMPRESSION: Negative CT head.

## 2017-08-08 ENCOUNTER — Telehealth: Payer: Self-pay | Admitting: Physician Assistant

## 2017-08-08 NOTE — Telephone Encounter (Signed)
Pt brought in paperwork for Mcvey to fill out a biometric form. He will come by and pick up when ready. Please call at [phone]. I have placed form at 102 nurses box.

## 2017-08-13 NOTE — Telephone Encounter (Signed)
Is this form still needing to be filled out? Can you pls check nurses box?

## 2017-08-15 NOTE — Telephone Encounter (Signed)
Completed form.  Needs McVey's signature.  Placed in her box at nurses desk for her signature.

## 2017-08-16 ENCOUNTER — Telehealth: Payer: Self-pay | Admitting: *Deleted

## 2017-08-16 NOTE — Telephone Encounter (Signed)
Copy of form placed in scan box.

## 2017-08-16 NOTE — Telephone Encounter (Signed)
Spoke with patient advised that form is ready for pickup. Pt stated that he will p/u on Saturday.

## 2017-09-24 ENCOUNTER — Encounter: Payer: Self-pay | Admitting: Physician Assistant

## 2017-09-24 ENCOUNTER — Ambulatory Visit (INDEPENDENT_AMBULATORY_CARE_PROVIDER_SITE_OTHER): Payer: 59 | Admitting: Physician Assistant

## 2017-09-24 VITALS — BP 140/86 | HR 80 | Temp 99.5°F | Resp 16 | Ht 65.0 in | Wt 147.0 lb

## 2017-09-24 DIAGNOSIS — Z0000 Encounter for general adult medical examination without abnormal findings: Secondary | ICD-10-CM

## 2017-09-24 DIAGNOSIS — Z13220 Encounter for screening for lipoid disorders: Secondary | ICD-10-CM | POA: Diagnosis not present

## 2017-09-24 DIAGNOSIS — Z1329 Encounter for screening for other suspected endocrine disorder: Secondary | ICD-10-CM | POA: Diagnosis not present

## 2017-09-24 LAB — POCT URINALYSIS DIP (MANUAL ENTRY)
Bilirubin, UA: NEGATIVE
Blood, UA: NEGATIVE
Glucose, UA: NEGATIVE mg/dL
Ketones, POC UA: NEGATIVE mg/dL
Leukocytes, UA: NEGATIVE
Nitrite, UA: NEGATIVE
Protein Ur, POC: NEGATIVE mg/dL
Spec Grav, UA: 1.015 (ref 1.010–1.025)
Urobilinogen, UA: 0.2 U/dL
pH, UA: 6.5 (ref 5.0–8.0)

## 2017-09-24 NOTE — Progress Notes (Signed)
Primary Care at [facility] [address], [place] [id number] [phone]  Date:  09/24/2017   Name:  Zachary Carey   DOB:  11/11/1975   MRN:  [id number]  PCP:  Patient, No Pcp Per    Chief Complaint: Annual Exam   History of Present Illness:  This is a 42 y.o. male with PMH none who is presenting for CPE. He is not fasting today - he last ate three hours ago. [place]. 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  Last CPE was one year ago. He did not get a result letter from last CPE asking him to start Lipitor. He is not taking lipitor.   Complaints:  none Immunizations: utd Dentist: last year Eye: 20/20 Diet: white rice, vegetables, coffee, pork Exercise: none Fam hx: no known  Urinary hesitancy/frequency or nocturia: none Tobacco/alcohol/substance use: never smoker, does not drink, no drug use.   Review of Systems:  Review of Systems  Constitutional: Negative for activity change, appetite change and fatigue.  HENT: Negative for congestion, dental problem, sneezing and tinnitus.   Eyes: Negative for visual disturbance.  Respiratory: Negative for cough, chest tightness, shortness of breath and wheezing.   Cardiovascular: Negative for chest pain, palpitations and leg swelling.  Gastrointestinal: Negative for abdominal pain, blood in stool, constipation, diarrhea, nausea and vomiting.  Endocrine: Negative for polydipsia, polyphagia and polyuria.  Genitourinary: Negative for decreased urine volume, difficulty urinating, discharge, hematuria, scrotal swelling and testicular pain.  Musculoskeletal: Negative for arthralgias, back pain and neck stiffness.  Allergic/Immunologic: Negative for environmental allergies and food allergies.  Neurological: Negative for dizziness, syncope, weakness, light-headedness and headaches.  Psychiatric/Behavioral: Negative for sleep disturbance. The patient is not nervous/anxious.     There are no  active problems to display for this patient.   Prior to Admission medications   Medication Sig Start Date End Date Taking? Authorizing Provider  atorvastatin (LIPITOR) 20 MG tablet Take 1 tablet (20 mg total) by mouth daily. Patient not taking: Reported on 09/24/2017 09/20/16   Hall Birchard, Madelaine BhatElizabeth Whitney, PA-C    No Known Allergies  History reviewed. No pertinent surgical history.  Social History   Tobacco Use  . Smoking status: Never Smoker  . Smokeless tobacco: Never Used  Substance Use Topics  . Alcohol use: No  . Drug use: No    History reviewed. No pertinent family history.  Medication list has been reviewed and updated.  Physical Examination:  Physical Exam  Constitutional: He is oriented to person, place, and time. He appears well-developed and well-nourished. No distress.  HENT:  Head: Normocephalic and atraumatic.  Right Ear: External ear normal.  Left Ear: External ear normal.  Nose: Nose normal.  Mouth/Throat: No oropharyngeal exudate.  Eyes: Pupils are equal, round, and reactive to light. Conjunctivae and EOM are normal.  Neck: Normal range of motion. No thyromegaly present.  Cardiovascular: Normal rate, regular rhythm, normal heart sounds and intact distal pulses.  No murmur heard. Pulmonary/Chest: Effort normal and breath sounds normal. No respiratory distress. He has no wheezes.  Abdominal: Soft. Bowel sounds are normal. He exhibits no distension and no mass. There is no tenderness.  Musculoskeletal: Normal range of motion. He exhibits no edema.  Lymphadenopathy:    He has no cervical adenopathy.  Neurological: He is alert and oriented to person, place, and time. He has normal reflexes.  Skin: Skin is warm and dry.  Psychiatric: He has a normal mood and affect.  His behavior is normal. Judgment and thought content normal.  Vitals reviewed.   BP 140/86   Pulse 80   Temp 99.5 F (37.5 C) (Oral)   Resp 16   Ht 5\' 5"  (1.651 m)   Wt 147 lb (66.7 kg)    SpO2 96%   BMI 24.46 kg/m   Assessment and Plan: 1. Annual physical exam - Pt is a 42 year old male who presents for annual exam. He did not get result letter last year about his high cholesterol and starting a statin. Advised low fat diet.  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 OK to fill out biometric form when he comes back in a few days. If glucose on Bmet is elevated, may need poct glucose fasting. RTC in one year for annual exam.  2. Screening, lipid - Lipid panel  3. Screening for endocrine disorder - Basic Metabolic Panel - POCT urinalysis dipstick  Marco Collie, PA-C  Primary Care at [facility] 09/24/2017 2:26 PM

## 2017-09-24 NOTE — Patient Instructions (Addendum)
Please bring your form back next week and we will complete the lab work section. We can then fax this to your employer.   Last year your cholesterol was high. Please read below about how to lower this. I will send you a letter in the mail with the results of your lab work.   Ch? ?? ?n h?n ch? ch?t be?o va? cholesterol Fat and Cholesterol Restricted Diet N?ng ?? ch?t bo v cholesterol cao trong mu cu?a quy? vi? c th? d?n ??n cc v?n ?? s?c kh?e khc nhau, ch?ng h?n nh? cc b?nh v? tim, m?ch mu, ti m?t, gan v tuy?n t?y. Ch?t bo l ca?c ngu?n n?ng l??ng t?p trung t?n t?i ? nhi?u d?ng khc nhau. M?t s? lo?i ch?t bo nh?t ??nh, bao g?m ch?t bo bo ha, c th? gy h?i khi th??a. Cholesterol l m?t ch?t m c? th? c?n ??n v?i m?t l??ng nh?. C? th? c?a quy? vi? ta?o ra t?t c? cholesterol c?n thi?t. Cholesterol d? th?a do th?c ph?m quy? vi? ?n. Khi quy? vi? c n?ng ?? cholesterol v ch?t bo bo ha cao trong mu, cc v?n ?? s?c kh?e c th? pht sinh v ch?t bo v cholesterol d? th??a s? ti?ch tu? d?c theo tha?nh cc m?ch mu, khi?n cc m?ch mu ? bi? he?p la?i. L?a ch?n ?ng lo?i th?c ph?m s? gip quy? vi? ki?m sot l??ng ch?t bo v cholesterol ?n va?o. ?i?u ny s? gip gi? cho n?ng ?? cc ch?t na?y trong mu c?a quy? vi? n?m trong gi?i h?n bnh th??ng v lm gi?m nguy c? m?c b?nh. K? ho?ch c?a ti l g? Chuyn gia ch?m [place] s?c kh?e c th? khuy?n ngh? quy? vi?:  H?n ch? l??ng ch?t bo tiu th? ?? m??c t? ______% tr? xu?ng theo t?ng l??ng calo m?i ngy.  H?n ch? l??ng cholesterol trong ch? ?? ?n c?a quy? vi? ?? m??c d??i _________mg m?i ngy.  ?n t? 20-30 gam ch?t x? m?i ngy.  Ti nn ch?n nh?ng lo?i ch?t bo no?  Ch?n cc ch?t bo t?t cho s?c kh?e th??ng xuyn h?n. Ch?n ch?t bo khng bo ha ??n v ch?t be?o khng ba?o ho?a ?a, ch?ng h?n nh? d?u  liu v d?u canola, h?t lanh, qu? c ch, h?nh nhn v cc lo?i h?t.  ?n thm ch?t bo omega-3. Nh??ng l?a ch?n h??p ly? bao g?m c h?i, c  thu, c mi, c ng?, d?u h?t lanh va? h?t lanh nghi?n. ???t mu?c tiu ?n c t nh?t hai l?n m?t tu?n.  H?n ch? cc ch?t bo bo ha. Ch?t bo bo ha ch? y?u ???c tm th?y trong cc s?n ph?m ??ng v?t, nh? th?t, b? v kem. Ca?c ngu?n ch?t bo bo ha t?? th??c v?t bao g?m d?u c?, d?u h?t c? v d?u d?a.  Hessie Diener cc th?c ph?m co? ch??a cc lo?i d?u hydro ha m?t ph?n. Nh?ng th?c ph?m ny ch?a ch?t bo chuy?n ha. V d? v? th?c ph?m ch?a ch?t bo chuy?n ho?a l b? th?c v?t, m?t s? b? th?c v?t ?o?ng h?p, bnh quy, bnh quy gin v bnh n??ng khc. Nh?ng nguyn t?c chung ti c?n tun theo l g? Nh?ng h??ng d?n cho vi?c ?n u?ng lnh m?nh na?y s? gip quy? vi? ki?m sot l??ng ch?t be?o v cholesterol ?n va?o:  Ki?m tra nhn th?c ph?m c?n th?n ?? nh?n bi?t th?c ph?m c ch?a ch?t bo chuy?n ha ho?c c hm l??ng ch?t bo bo ha cao.  Cho rau v [employer]  lt rau xanh vo m?t n?a ??a ??ng th?c ?n.  Cho ngu? c?c nguyn ca?m va?o m?t ph?n t? ??a. Hy tm t?? "whole" (nguyn ca?m) l t? ??u tin trong danh sch thnh ph?n th?c ?n.  Cho th?c ?n c protein thi?t na?c va?o m?t ph?n t? ??a.  H?n ch? tri cy ?? m??c hai b?a m?t ngy. Ch?n tri cy thay v n??c tri cy.  ?n nhi?u th?c ph?m c ch?a ch?t x? nh? to, bng c?i xanh, c r?t, ??u, ??u H-Lan v ??i m?ch.  ?n nhi?u th?c ?n n?u t?i nh v i?t th??c ?n ?? nh hng, th??c ?n t? ch?n v th?c ?n nhanh.  H?n ch? ho?c trnh u?ng r??u.  Ha?n ch? th?c ph?m co? nhi?u tinh b?t v ???ng.  H?n ch? th?c ph?m chin.  N?u ?n b?ng cc ph??ng php khc thay vi? chin. Bo? lo?, lu?c, n??ng v hun ??u l nh??ng l?a ch?n tuy?t v?i.  Gi?m cn n?u qu v? th?a cn. Gia?m ch? 5-10% cn n?ng c? th? ban ??u c th? gip i?ch cho s?c kh?e t?ng th? c?a quy? vi? v ng?n ng?a cc b?nh nh? ti?u ???ng v b?nh tim.  Ti c th? ?n nh?ng th?c ph?m no? Ng? c?c nguyn h?t  Ng? c?c nguyn ca?m nh? la m nguyn ca?m ho??c ho?c bnh m nguyn ca?m, bnh quy gin, ng? c?c v m  ?ng. B?t y?n m?ch khng ???ng, mi? bulgur, la m?ch, dim ma?ch (quinoa) ho?c g?o l??t. Ng ho?c bnh b?t m nguyn ca?m. Marlou Starks c?  Marlou Starks c? t??i ho?c ?ng l?nh (ch?a ch? bi?n, h?p, bo? lo? ho?c n??ng). Toney Reil tr?n. Tri cy  T?t c? cc lo?i tra?i cy t??i, ?ng h?p (d???i da?ng n??c p t? nhin) ho?c ?ng l?nh. Th?t v nh?ng ngu?n th?c ph?m ch?a protein khc  Th?t b xay (85% ho?c na?c h?n), thi?t bo? ?n c? ho?c th?t b c?t bo? m??. Ga? ho??c ga? ty bo? da. G ho?c g ty xay. Th?t l??n c??t bo? m??. T?t c? c v h?i s?n. Tr?ng. Cc lo?i ??u, ??u H Lan ho??c ??u l?ng s?y kh. Cc lo?i qu? h?ch v h?t khng ??p mu?i. ??u ?ng h?p ho??c ??u s?y kh khng ??p mu?i. B? s?a  Cc s?n ph?m t? s?a t bo nh? s?a g?y ho?c s?a 1%, 2% ho?c pho mt t bo, ricotta i?t bo ho?c pho mt t??i ho?c s?a chua t bo. M? v d?u  B? th?c v?t ?ng h?p khng c ch?t bo chuy?n ha. Mayonnaise v n???c tr?n rau tr?n nh? ho??c i?t ch?t be?o. Qu? b?. D?u  liu, d?u canola, d?u m ho??c d?u hoa rum. B? ??u ph?ng ho?c h?nh nhn thin nhin (ch?n nh?ng loa?i khng co? thm ???ng v d?u). Nh?ng th?c ph?m li?t k ? trn c th? khng ph?i l m?t danh m?c ??y ?? cc th?c ph?m v ?? u?ng ???c khuy?n ngh?Augustin Coupe h? v?i chuyn gia dinh d??ng ?? c thm s? l?a ch?n. Cc th?c ph?m c?n trnh Ng? c?c nguyn h?t  Bnh m tr?ng. M tr?ng. G?o tr?ng. Bnh m ng. Bnh m trn, bnh ng?t v bnh s?ng b. Bnh quy gin ch?a ch?t bo chuy?n ha. Marlou Starks c?  Khoai ty tr?ng. Howie Ill tr?n kem ho??c rau xa?o. Cc lo?i rau tr?n n??c s?t pho mt. Tri cy  Tri cy s?y kh. Tri cy ?ng h?p ngm xi-r loa?ng ho??c ???c. N??c p tri cy. Th?t v nh?ng ngu?n th?c ph?m ch?a protein khc  La?t thi?t m??. X??ng s??n, cnh g, th?t xng khi, xc xch, xc xch hun khi, xc xch Y?, lo?ng l??n, m? l?ng l??n, xc xch hot dog, xc xi?ch ?? ra?n v th?t ?n tr?a ?ng gi. Gan v thi?t n?i ta?ng. B? s?a  S??a nguyn kem ho??c 2%,  kem, h?n h??p s??a nguyn kem va? kem t??i v pho mt kem. Pho mt s?a nguyn kem. S??a chua nguyn ch?t bo ho?c s?a chua co? ????ng. Pho mt nguyn ch?t bo. B?t kem khng s??a v l??p phu? kem ?a? ?a?nh bng. Pho mt ch? bi?n, ph?t pho mt, ho??c s??a ?ng pho mt. ?? u?ng  R??u. ?? u?ng c ???ng (nh? soda, n??c chanh v n??c tri cy ho?c r???u pn). M? v d?u  B?, b? th?c v?t da?ng tho?i, m? l?n, m?? pha ba?nh, b? s?a tru l?ng ho?c ch?t bo th?t xng khi. D?u d?a, h?t c?, ho?c c?. K?o v ?? trng mi?ng  Xi-r ng, ???ng, m?t ong v m?t ????ng. K?o. M??t v th?ch. Xi r. Ngu? c?c co? ????ng. Ba?nh quy, bnh n??ng, bnh ng?t, bnh rn, bnh n??ng x?p v kem. Nh?ng th?c ph?m li?t k ? trn c th? khng ph?i l m?t danh m?c ??y ?? cc th?c ph?m v ?? u?ng c?n trnh. Lin h? v?i chuyn gia dinh d??ng ?? c thm thng tin. Thng tin ny khng nh?m m?c ?ch thay th? cho l?i khuyn m chuyn gia ch?m [place] s?c kh?e ni v?i qu v?. Hy b?o ??m qu v? ph?i th?o lu?n b?t k? v?n ?? g m qu v? c v?i chuyn gia ch?m  s?c kh?e c?a qu v?. Document Released: 09/26/2015 Document Revised: 06/22/2016 Document Reviewed: 09/02/2013 Elsevier Interactive Patient Education  2018 [employer].  Thank you for coming in today. I hope you feel we met your needs.  Feel free to call PCP if you have any questions or further requests.  Please consider signing up for MyChart if you do not already have it, as this is a great way to communicate with me.  Best,  Whitney McVey, PA-C   IF you received an x-ray today, you will receive an invoice from [facility]. Please contact [facility] at [phone] with questions or concerns regarding your invoice.   IF you received labwork today, you will receive an invoice from [place]. Please contact [facility] at [phone] with questions or concerns regarding your invoice.   Our billing staff will not be able to assist you with questions regarding  bills from these companies.  You will be contacted with the lab results as soon as they are available. The fastest way to get your results is to activate your My Chart account. Instructions are located on the last page of this paperwork. If you have not heard from [place] regarding the results in 2 weeks, please contact this office.

## 2017-09-25 ENCOUNTER — Encounter: Payer: Self-pay | Admitting: Physician Assistant

## 2017-09-25 DIAGNOSIS — E785 Hyperlipidemia, unspecified: Secondary | ICD-10-CM

## 2017-09-25 LAB — BASIC METABOLIC PANEL
CO2: 24 mmol/L (ref 20–29)
GFR calc non Af Amer: 102 mL/min/{1.73_m2} (ref >59)
Glucose: 86 mg/dL (ref 65–99)
Potassium: 4 mmol/L (ref 3.5–5.2)
Sodium: 141 mmol/L (ref 134–144)

## 2017-09-25 LAB — LIPID PANEL
Chol/HDL Ratio: 5.6 ratio — ABNORMAL HIGH (ref 0.0–5.0)
Cholesterol, Total: 264 mg/dL — ABNORMAL HIGH (ref 100–199)
HDL: 47 mg/dL (ref >39)
LDL Calculated: 185 mg/dL — ABNORMAL HIGH (ref 0–99)
Triglycerides: 160 mg/dL — ABNORMAL HIGH (ref 0–149)
VLDL Cholesterol Cal: 32 mg/dL (ref 5–40)

## 2017-09-25 LAB — BASIC METABOLIC PANEL WITH GFR
BUN/Creatinine Ratio: 13 (ref 9–20)
BUN: 12 mg/dL (ref 6–24)
Calcium: 9.2 mg/dL (ref 8.7–10.2)
Chloride: 103 mmol/L (ref 96–106)
Creatinine, Ser: 0.92 mg/dL (ref 0.76–1.27)
GFR calc Af Amer: 117 mL/min/{1.73_m2} (ref >59)

## 2017-09-25 MED ORDER — ATORVASTATIN CALCIUM 20 MG PO TABS
20.0000 mg | ORAL_TABLET | Freq: Every day | ORAL | 3 refills | Status: DC
Start: 2017-09-25 — End: 2019-02-27

## 2017-09-27 ENCOUNTER — Telehealth: Payer: Self-pay | Admitting: *Deleted

## 2017-09-27 NOTE — Telephone Encounter (Signed)
LMOM for pt to come the office to complete his portion of the form, and there is a lab letter attached for him. The form is in the box for Rx at the nurses's station.

## 2018-10-21 ENCOUNTER — Telehealth: Payer: 59 | Admitting: Family Medicine

## 2019-02-27 ENCOUNTER — Other Ambulatory Visit: Payer: Self-pay

## 2019-02-27 ENCOUNTER — Ambulatory Visit (INDEPENDENT_AMBULATORY_CARE_PROVIDER_SITE_OTHER): Payer: 59 | Admitting: Family Medicine

## 2019-02-27 VITALS — BP 133/78 | HR 64 | Temp 97.7°F | Resp 14 | Wt 152.4 lb

## 2019-02-27 DIAGNOSIS — E785 Hyperlipidemia, unspecified: Secondary | ICD-10-CM

## 2019-02-27 NOTE — Progress Notes (Signed)
Subjective:    Patient ID: Zachary Carey, male    DOB: [birth year], 43 y.o.   MRN: [id number]  HPI Zachary NorfolkGareth Penick is a 43 y.o. male Presents today for: Chief Complaint  Patient presents with  . Hyperlipidemia    here today for f/u. No other issus at this time  some english spoken, but repeated questions - did not appear to understand questions - vietnamese interpreter [phone] video interpreter.   Hyperlipidemia:  Lab Results  Component Value Date   CHOL 264 (H) 09/24/2017   HDL 47 09/24/2017   LDLCALC 185 (H) 09/24/2017   TRIG 160 (H) 09/24/2017   CHOLHDL 5.6 (H) 09/24/2017   Lab Results  Component Value Date   ALT 28 07/06/2015   AST 22 07/06/2015   ALKPHOS 38 (L) 07/06/2015   BILITOT 0.6 07/06/2015  Significant hyperlipidemia noted in 2009 as above. Atorvastatin 40 mg prescribed, with plan for 8477-month follow-up. Did take lipitor when prescribed last year. No repeat testing on meds. Not on meds recently.flet ok on meds - no issues.   No FH of MI/CVA/CAD.   The 10-year ASCVD risk score Denman George(Goff DC Montez HagemanJr., et al., 2013) is: 3.7%   Values used to calculate the score:     Age: [age] years     Sex: Male     Is Non-Hispanic African American: No     Diabetic: No     Tobacco smoker: No     Systolic Blood Pressure: 133 mmHg     Is BP treated: No     HDL Cholesterol: 47 mg/dL     Total Cholesterol: 264 mg/dL  There are no active problems to display for this patient.  No past medical history on file. No past surgical history on file. No Known Allergies Prior to Admission medications   Not on File   Social History   Socioeconomic History  . Marital status: Married    Spouse name: Not on file  . Number of children: Not on file  . Years of education: Not on file  . Highest education level: Not on file  Occupational History  . Not on file  Social Needs  . Financial resource strain: Not on file  . Food insecurity    Worry: Not on file    Inability: Not on file  .  Transportation needs    Medical: Not on file    Non-medical: Not on file  Tobacco Use  . Smoking status: Never Smoker  . Smokeless tobacco: Never Used  Substance and Sexual Activity  . Alcohol use: No  . Drug use: No  . Sexual activity: Yes  Lifestyle  . Physical activity    Days per week: Not on file    Minutes per session: Not on file  . Stress: Not on file  Relationships  . Social Musicianconnections    Talks on phone: Not on file    Gets together: Not on file    Attends religious service: Not on file    Active member of club or organization: Not on file    Attends meetings of clubs or organizations: Not on file    Relationship status: Not on file  . Intimate partner violence    Fear of current or ex partner: Not on file    Emotionally abused: Not on file    Physically abused: Not on file    Forced sexual activity: Not on file  Other Topics Concern  . Not on file  Social History Narrative  .  Not on file    Review of Systems Per HPI.     Objective:   Physical Exam Vitals signs reviewed.  Constitutional:      Appearance: He is well-developed.  HENT:     Head: Normocephalic and atraumatic.  Eyes:     Pupils: Pupils are equal, round, and reactive to light.  Neck:     Vascular: No carotid bruit or JVD.  Cardiovascular:     Rate and Rhythm: Normal rate and regular rhythm.     Heart sounds: Normal heart sounds. No murmur.  Pulmonary:     Effort: Pulmonary effort is normal.     Breath sounds: Normal breath sounds. No rales.  Skin:    General: Skin is warm and dry.  Neurological:     Mental Status: He is alert and oriented to person, place, and time.    Vitals:   02/27/19 1549  BP: 133/78  Pulse: 64  Resp: 14  Temp: 97.7 F (36.5 C)  TempSrc: Oral  SpO2: 96%  Weight: 152 lb 6.4 oz (69.1 kg)          Assessment & Plan:   Josephine Moschella is a 43 y.o. male Hyperlipidemia, unspecified hyperlipidemia type - Plan: Lipid Panel, Comprehensive metabolic panel   -Previous elevation but overall low ASCVD risk score.  Repeat testing then determine statin need.  Handout on prevention given  No orders of the defined types were placed in this encounter.  Patient Instructions    I will recheck cholesterol. Depending on results, may need to be on meds. If we restart meds, then need to recheck levels in 6 weeks.    Phng ng?a cholesterol cao Preventing High Cholesterol Cholesterol l m?t ch?t gi?ng ch?t bo d?ng sp m c? th? c?n v?i l??ng nh?Marchelle Gearing c?a quy? vi? ta?o ra t?t c? cholesterol m c? th? c?n. B? cholesterol cao (cholesterol mu cao) lm t?ng nguy c? b? b?nh tim v ??t qu?Marland Kitchen Cholesterol d? (d? th?a) trong th?c ph?m qu v? ?n, ch?ng h?n nh? ch?t bo ??ng v?t (ch?t bo bo ha) t? th?t v m?t s? s?n ph?m s?a. Th??ng c th? phng ng?a cholesterol cao b?ng cch thay ??i ch? ?? ?n v l?i s?ng. N?u qu v? ? c cholesterol cao, qu v? c th? ki?m sot n b?ng cch thay ??i ch? ?? ?n v l?i s?ng, c?ng nh? l dng thu?c. C th? th?c hi?n nh?ng thay ??i dinh d??ng no khc?  ?n t ch?t bo bo ha h?n. Cc th?c ph?m c ch?a ch?t bo bo ha bao g?m th?t ?? v m?t s? s?n ph?m s?a.  Trnh th?t ch? bi?n s?n, nh? l th?t xng khi v th?t ?n tr?a.  [place] cc ch?t bo chuy?n ha, chng ???c tm th?y trong b? th?c v?t v m?t s? lo?i bnh n??ng.  [place] cc th?c ph?m v ?? u?ng c thm ???ng.  ?n nhi?u tri cy, rau c? v ng? c?c nguyn cm h?n.  Ch?n cc ngu?n protein t?t cho s?c kh?e, ch?ng h?n nh? c, th?t gia c?m v qu? h?ch.  Ch?n cc ngu?n ch?t bo t?t cho s?c kh?e, ch?ng h?n nh?: ? Qu? h?ch. ? D?u th?c v?t, ??c bi?t l d?u  liu. ? C c ch?a ch?t bo t?t cho s?c kh?e (axit bo omega-3), ch?ng h?n nh? c thu ho?c c h?i. C th? th?c hi?n nh?ng thay ??i no v? l?i s?ng?   Gi?m cn n?u qu v? th?a cn. Gi?m 5-10 lb (2,3-4,5 kg)  c th? gip phng ng?a ho?c ki?m sot cholesterol cao v gi?m nguy c? b? ti?u ???ng v huy?t p cao. Hy yu c?u chuyn gia  ch?m [place] s?c kh?e gip qu v? l?p m?t k? ho?ch t?p th? d?c v ch? ?? ?n ?? gi?m cn an ton.  T?p th? d?c ??y ??. Th??c hi?n i?t nh?t 150 phu?t t?p th? du?c v?i c???ng ?? trung bi?nh m?i tu?n. ? Qu v? c th? lm ?i?u ny trong cc phin t?p ng?n, vi l?n m?i ngy ho?c c th? t?p nh?ng phin t?p di h?n m?t vi l?n m?i tu?n. V d?: qu v? c th? ?i b? nhanh ho?c ??p xe trong 10 pht, 3 l?n m?i ngy, trong 5 ngy m?i tu?n.  Khng ht thu?c. N?u qu v? c?n gip ?? ?? cai thu?c, hy h?i chuyn gia ch?m [place] s?c kh?e.  Gi?i h?n l??ng r??u qu v? tiu th?. N?u qu v? u?ng r??u, hy gi?i h?n l??ng r??u qu v? u?ng ? m?c khng qu 1 ly m?i ngy v?i ph? n? khng mang thai v 2 ly m?i ngy v?i nam gi?i. M?t ly t??ng ???ng v?i 12 ao-x? bia, 5 ao-x? r??u vang, ho?c 1 ao-x? r??u m?nh. T?i sao nh?ng thay ??i ny l?i quan tr?ng?  N?u qu v? c cholesterol cao, cc c?n ?ng (m?ng bm) c th? tch t? trn thnh c?a cc m?ch mu. Cc m?ng bm khi?n ??ng m?ch h?p v c?ng h?n, ?i?u ny c th? lm h?n ch? ho?c c?n tr? dng mu l?u thng v khi?n cc c?c mu ?ng hnh thnh. ?i?u ny lm t?ng m?nh nguy c? b? nh?i mu c? tim v ??t qu?Marland Kitchen. Thay ??i ch? ?? ?n v l?i s?ng c th? gi?m nguy c? qu v? b? nh?ng tnh tr?ng ?e d?a tnh m?ng ny. Ti c th? lm g ?? gi?m nguy c??  Qu?n l cc y?u t? nguy c? b? cholesterol cao c?a qu v?. Hy trao ??i v?i chuyn gia ch?m [place] s?c kh?e v? t?t c? cc y?u t? nguy c? v cch gi?m b?t nguy c? c?a qu v?.  Qu?n l cc tnh tr?ng khc m qu v? c, ch?ng h?n nh? b?nh ti?u ???ng ho?c cao huy?t p (t?ng huy?t p).  ??nh k? ki?m tra l??ng cholesterol c?a qu v?.  Tun th? t?t c? cc l?n khm theo di theo ch? d?n c?a chuyn gia ch?m [place] s?c kh?e. ?i?u ny c vai tr quan tr?ng. Tnh tr?ng ny ???c ?i?u tr? nh? th? no? Ngoi thay ??i ch? ?? ?n v l?i s?ng, chuyn gia ch?m [place] s?c kh?e c th? khuy?n ngh? dng cc thu?c gip h? cholesterol, ch?ng h?n nh? thu?c lm gi?m l??ng  cholesterol ???c t?o ra ? gan qu v?. Qu v? c th? c?n dng thu?c n?u:  Thay ??i ch? ?? ?n v l?i s?ng khng ?? lm gi?m cholesterol.  Qu v? c cholesterol cao v cc y?u t? nguy c? khc ??i v?i b?nh tim ho?c ??t qu?Marland Kitchen. Ch? s? d?ng thu?c khng k ??n v thu?c k ??n theo ch? d?n c?a chuyn gia ch?m  s?c kh?e. N?i ?? tm thm thng tin  Hi?p h?i Tim m?ch [place] K?: GamingProblem.com.brwww.heart.org/HEARTORG/Conditions/Cholesterol/Cholesterol_UCM_001089_SubHomePage.jsp  Vi?n Tim, Ph?i v Mu Qu?c gia: http://hood.com/www.nhlbi.nih.gov/health/resources/heart/heart-cholesterol-hbc-what-html Tm t?t  Cholesterol cao lm t?ng nguy c? b? b?nh tim v ??t qu? c?a qu v?. B?ng cch duy tr cholesterol ? m?c th?p, qu v? c th? gi?m nguy c? b? nh?ng tnh tr?ng ny.  Thay ??i ch? ?? ?  n v l?i s?ng l nh?ng b??c quan tr?ng nh?t trong vi?c phng ng?a cholesterol cao.  Hy lm vi?c v?i chuyn gia ch?m [place] s?c kh?e ?? qu?n l cc y?u t? nguy c? c?a qu v? v lm xt nghi?m mu th??ng xuyn. Thng tin ny khng nh?m m?c ?ch thay th? cho l?i khuyn m chuyn gia ch?m [place] s?c kh?e ni v?i qu v?. Hy b?o ??m qu v? ph?i th?o lu?n b?t k? v?n ?? g m qu v? c v?i chuyn gia ch?m [place] s?c kh?e c?a qu v?. Document Released: 09/20/2016 Document Revised: 09/20/2016 Elsevier Patient Education  [employer].   If you have lab work done today you will be contacted with your lab results within the next 2 weeks.  If you have not heard from [place] then please contact us. The fastest way to get your results is to register for My Chart.   IF you received an x-ray today, you will receive an invoice from [facility]. Please contact [facility] at [phone] with questions or concerns regarding your invoice.   IF you received labwork today, you will receive an invoice from [place]. Please contact [facility] at [phone] with questions or concerns regarding your invoice.   Our billing staff will not be able to assist you with  questions regarding bills from these companies.  You will be contacted with the lab results as soon as they are available. The fastest way to get your results is to activate your My Chart account. Instructions are located on the last page of this paperwork. If you have not heard from [place] regarding the results in 2 weeks, please contact this office.       Signed,   Merri Ray, MD [facility].  03/01/19 4:38 PM

## 2019-02-27 NOTE — Patient Instructions (Addendum)
I will recheck cholesterol. Depending on results, may need to be on meds. If we restart meds, then need to recheck levels in 6 weeks.    Phng ng?a cholesterol cao Preventing High Cholesterol Cholesterol l m?t ch?t gi?ng ch?t bo d?ng sp m c? th? c?n v?i l??ng nh?Marchelle Gearing c?a quy? vi? ta?o ra t?t c? cholesterol m c? th? c?n. B? cholesterol cao (cholesterol mu cao) lm t?ng nguy c? b? b?nh tim v ??t qu?Marland Kitchen Cholesterol d? (d? th?a) trong th?c ph?m qu v? ?n, ch?ng h?n nh? ch?t bo ??ng v?t (ch?t bo bo ha) t? th?t v m?t s? s?n ph?m s?a. Th??ng c th? phng ng?a cholesterol cao b?ng cch thay ??i ch? ?? ?n v l?i s?ng. N?u qu v? ? c cholesterol cao, qu v? c th? ki?m sot n b?ng cch thay ??i ch? ?? ?n v l?i s?ng, c?ng nh? l dng thu?c. C th? th?c hi?n nh?ng thay ??i dinh d??ng no khc?  ?n t ch?t bo bo ha h?n. Cc th?c ph?m c ch?a ch?t bo bo ha bao g?m th?t ?? v m?t s? s?n ph?m s?a.  Trnh th?t ch? bi?n s?n, nh? l th?t xng khi v th?t ?n tr?a.  [place] cc ch?t bo chuy?n ha, chng ???c tm th?y trong b? th?c v?t v m?t s? lo?i bnh n??ng.  [place] cc th?c ph?m v ?? u?ng c thm ???ng.  ?n nhi?u tri cy, rau c? v ng? c?c nguyn cm h?n.  Ch?n cc ngu?n protein t?t cho s?c kh?e, ch?ng h?n nh? c, th?t gia c?m v qu? h?ch.  Ch?n cc ngu?n ch?t bo t?t cho s?c kh?e, ch?ng h?n nh?: ? Qu? h?ch. ? D?u th?c v?t, ??c bi?t l d?u  liu. ? C c ch?a ch?t bo t?t cho s?c kh?e (axit bo omega-3), ch?ng h?n nh? c thu ho?c c h?i. C th? th?c hi?n nh?ng thay ??i no v? l?i s?ng?   Gi?m cn n?u qu v? th?a cn. Gi?m 5-10 lb (2,3-4,5 kg) c th? gip phng ng?a ho?c ki?m sot cholesterol cao v gi?m nguy c? b? ti?u ???ng v huy?t p cao. Hy yu c?u chuyn gia ch?m [place] s?c kh?e gip qu v? l?p m?t k? ho?ch t?p th? d?c v ch? ?? ?n ?? gi?m cn an ton.  T?p th? d?c ??y ??. Th??c hi?n i?t nh?t 150 phu?t t?p th? du?c v?i c???ng ?? trung bi?nh m?i tu?n. ? Qu v? c th?  lm ?i?u ny trong cc phin t?p ng?n, vi l?n m?i ngy ho?c c th? t?p nh?ng phin t?p di h?n m?t vi l?n m?i tu?n. V d?: qu v? c th? ?i b? nhanh ho?c ??p xe trong 10 pht, 3 l?n m?i ngy, trong 5 ngy m?i tu?n.  Khng ht thu?c. N?u qu v? c?n gip ?? ?? cai thu?c, hy h?i chuyn gia ch?m [place] s?c kh?e.  Gi?i h?n l??ng r??u qu v? tiu th?. N?u qu v? u?ng r??u, hy gi?i h?n l??ng r??u qu v? u?ng ? m?c khng qu 1 ly m?i ngy v?i ph? n? khng mang thai v 2 ly m?i ngy v?i nam gi?i. M?t ly t??ng ???ng v?i 12 ao-x? bia, 5 ao-x? r??u vang, ho?c 1 ao-x? r??u m?nh. T?i sao nh?ng thay ??i ny l?i quan tr?ng?  N?u qu v? c cholesterol cao, cc c?n ?ng (m?ng bm) c th? tch t? trn thnh c?a cc m?ch mu. Cc m?ng bm khi?n ??ng m?ch h?p v c?ng h?n, ?i?u ny c th? lm h?n  ch? ho?c c?n tr? dng mu l?u thng v khi?n cc c?c mu ?ng hnh thnh. ?i?u ny lm t?ng m?nh nguy c? b? nh?i mu c? tim v ??t qu?Marland Kitchen Thay ??i ch? ?? ?n v l?i s?ng c th? gi?m nguy c? qu v? b? nh?ng tnh tr?ng ?e d?a tnh m?ng ny. Ti c th? lm g ?? gi?m nguy c??  Qu?n l cc y?u t? nguy c? b? cholesterol cao c?a qu v?. Hy trao ??i v?i chuyn gia ch?m [place] s?c kh?e v? t?t c? cc y?u t? nguy c? v cch gi?m b?t nguy c? c?a qu v?.  Qu?n l cc tnh tr?ng khc m qu v? c, ch?ng h?n nh? b?nh ti?u ???ng ho?c cao huy?t p (t?ng huy?t p).  ??nh k? ki?m tra l??ng cholesterol c?a qu v?.  Tun th? t?t c? cc l?n khm theo di theo ch? d?n c?a chuyn gia ch?m [place] s?c kh?e. ?i?u ny c vai tr quan tr?ng. Tnh tr?ng ny ???c ?i?u tr? nh? th? no? [place] thay ??i ch? ?? ?n v l?i s?ng, chuyn gia ch?m [place] s?c kh?e c th? khuy?n ngh? dng cc thu?c gip h? cholesterol, ch?ng h?n nh? thu?c lm gi?m l??ng cholesterol ???c t?o ra ? gan qu v?. Qu v? c th? c?n dng thu?c n?u:  Thay ??i ch? ?? ?n v l?i s?ng khng ?? lm gi?m cholesterol.  Qu v? c cholesterol cao v cc y?u t? nguy c? khc ??i v?i b?nh tim ho?c ??t qu?Marland Kitchen Ch?  s? d?ng thu?c khng k ??n v thu?c k ??n theo ch? d?n c?a chuyn gia ch?m [place] s?c kh?e. N?i ?? tm thm thng tin  Hi?p h?i Tim m?ch [place]?: [email or web address]  Vi?n Tim, Ph?i v Mu Qu?c gia: [email or web address] Tm t?t  Cholesterol cao lm t?ng nguy c? b? b?nh tim v ??t qu? c?a qu v?. B?ng cch duy tr cholesterol ? m?c th?p, qu v? c th? gi?m nguy c? b? nh?ng tnh tr?ng ny.  Thay ??i ch? ?? ?n v l?i s?ng l nh?ng b??c quan tr?ng nh?t trong vi?c phng ng?a cholesterol cao.  Hy lm vi?c v?i chuyn gia ch?m [place] s?c kh?e ?? qu?n l cc y?u t? nguy c? c?a qu v? v lm xt nghi?m mu th??ng xuyn. Thng tin ny khng nh?m m?c ?ch thay th? cho l?i khuyn m chuyn gia ch?m [place] s?c kh?e ni v?i qu v?. Hy b?o ??m qu v? ph?i th?o lu?n b?t k? v?n ?? g m qu v? c v?i chuyn gia ch?m [place] s?c kh?e c?a qu v?. Document Released: 09/20/2016 Document Revised: 09/20/2016 Elsevier Patient Education  [employer].   If you have lab work done today you will be contacted with your lab results within the next 2 weeks.  If you have not heard from [place] then please contact us. The fastest way to get your results is to register for My Chart.   IF you received an x-ray today, you will receive an invoice from [facility]. Please contact [facility] at [phone] with questions or concerns regarding your invoice.   IF you received labwork today, you will receive an invoice from [facility]. Please contact [facility] at [phone] with questions or concerns regarding your invoice.   Our billing staff will not be able to assist you with questions regarding bills from these companies.  You will be contacted with the lab results as soon as they are available. The fastest way to get your results is to activate your  My Chart account. Instructions are located on the  last page of this paperwork. If you have not heard from us regarding the results in 2 weeks, please contact this office.

## 2019-02-28 LAB — COMPREHENSIVE METABOLIC PANEL
ALT: 141 IU/L — ABNORMAL HIGH (ref 0–44)
AST: 72 IU/L — ABNORMAL HIGH (ref 0–40)
Albumin/Globulin Ratio: 1.4 (ref 1.2–2.2)
Albumin: 4.2 g/dL (ref 4.0–5.0)
Alkaline Phosphatase: 48 IU/L (ref 39–117)
BUN/Creatinine Ratio: 12 (ref 9–20)
BUN: 12 mg/dL (ref 6–24)
Bilirubin Total: 0.4 mg/dL (ref 0.0–1.2)
CO2: 20 mmol/L (ref 20–29)
Calcium: 8.8 mg/dL (ref 8.7–10.2)
Chloride: 106 mmol/L (ref 96–106)
Creatinine, Ser: 0.99 mg/dL (ref 0.76–1.27)
GFR calc Af Amer: 106 mL/min/{1.73_m2} (ref >59)
GFR calc non Af Amer: 92 mL/min/{1.73_m2} (ref >59)
Globulin, Total: 2.9 g/dL (ref 1.5–4.5)
Glucose: 90 mg/dL (ref 65–99)
Potassium: 4.2 mmol/L (ref 3.5–5.2)
Sodium: 142 mmol/L (ref 134–144)
Total Protein: 7.1 g/dL (ref 6.0–8.5)

## 2019-02-28 LAB — LIPID PANEL
Chol/HDL Ratio: 4.1 ratio (ref 0.0–5.0)
Cholesterol, Total: 199 mg/dL (ref 100–199)
HDL: 49 mg/dL (ref >39)
LDL Chol Calc (NIH): 126 mg/dL — ABNORMAL HIGH (ref 0–99)
Triglycerides: 137 mg/dL (ref 0–149)
VLDL Cholesterol Cal: 24 mg/dL (ref 5–40)

## 2019-03-01 ENCOUNTER — Encounter: Payer: Self-pay | Admitting: Family Medicine

## 2019-03-11 ENCOUNTER — Telehealth: Payer: Self-pay

## 2019-03-11 ENCOUNTER — Telehealth: Payer: Self-pay | Admitting: Family Medicine

## 2019-03-11 NOTE — Telephone Encounter (Signed)
Left message for pt to call bk for lab result

## 2019-03-11 NOTE — Telephone Encounter (Signed)
Pt's wife would like a cb concerning her husband's lab results. She is on his dpr. Please advise at [phone]

## 2019-10-02 ENCOUNTER — Other Ambulatory Visit: Payer: Self-pay

## 2019-10-02 ENCOUNTER — Encounter: Payer: Self-pay | Admitting: Family Medicine

## 2019-10-02 ENCOUNTER — Ambulatory Visit (INDEPENDENT_AMBULATORY_CARE_PROVIDER_SITE_OTHER): Payer: 59 | Admitting: Family Medicine

## 2019-10-02 VITALS — BP 142/90 | HR 73 | Temp 98.7°F | Ht 65.0 in | Wt 155.0 lb

## 2019-10-02 DIAGNOSIS — Z13228 Encounter for screening for other metabolic disorders: Secondary | ICD-10-CM

## 2019-10-02 DIAGNOSIS — Z0001 Encounter for general adult medical examination with abnormal findings: Secondary | ICD-10-CM | POA: Diagnosis not present

## 2019-10-02 DIAGNOSIS — Z1329 Encounter for screening for other suspected endocrine disorder: Secondary | ICD-10-CM

## 2019-10-02 DIAGNOSIS — Z0000 Encounter for general adult medical examination without abnormal findings: Secondary | ICD-10-CM

## 2019-10-02 DIAGNOSIS — E785 Hyperlipidemia, unspecified: Secondary | ICD-10-CM | POA: Diagnosis not present

## 2019-10-02 NOTE — Patient Instructions (Addendum)
I do recommend follow up with dentist and eye specialist.  Thank you for coming in today and take care.  Keeping you healthy  Get these tests  Blood pressure- Have your blood pressure checked once a year by your healthcare provider.  Normal blood pressure is 120/80.  Weight- Have your body mass index (BMI) calculated to screen for obesity.  BMI is a measure of body fat based on height and weight. You can also calculate your own BMI at [email or web address].west-esparza.com/.  Cholesterol- Have your cholesterol checked regularly starting at age 75, sooner may be necessary if you have diabetes, high blood pressure, if a family member developed heart diseases at an early age or if you smoke.   Chlamydia, HIV, and other sexual transmitted disease- Get screened each year until the age of 85 then within three months of each new sexual partner.  Diabetes- Have your blood sugar checked regularly if you have high blood pressure, high cholesterol, a family history of diabetes or if you are overweight.  Get these vaccines  Flu shot- Every fall.  Tetanus shot- Every 10 years.  Menactra- Single dose; prevents meningitis.  Take these steps  Don't smoke- If you do smoke, ask your healthcare provider about quitting. For tips on how to quit, go to www.smokefree.gov or call 1-800-QUIT-NOW.  Be physically active- Exercise 5 days a week for at least 30 minutes.  If you are not already physically active start slow and gradually work up to 30 minutes of moderate physical activity.  Examples of moderate activity include walking briskly, mowing the yard, dancing, swimming bicycling, etc.  Eat a healthy diet- Eat a variety of healthy foods such as fruits, vegetables, low fat milk, low fat cheese, yogurt, lean meats, poultry, fish, beans, tofu, etc.  For more information on healthy eating, go to www.thenutritionsource.org  Drink alcohol in moderation- Limit alcohol intake two drinks or less a day.  Never drink and  drive.  Dentist- Brush and floss teeth twice daily; visit your dentis twice a year.  Depression-Your emotional health is as important as your physical health.  If you're feeling down, losing interest in things you normally enjoy please talk with your healthcare provider.  Gun Safety- If you keep a gun in your home, keep it unloaded and with the safety lock on.  Bullets should be stored separately.  Helmet use- Always wear a helmet when riding a motorcycle, bicycle, rollerblading or skateboarding.  Safe sex- If you may be exposed to a sexually transmitted infection, use a condom  Seat belts- Seat bels can save your life; always wear one.  Smoke/Carbon Monoxide detectors- These detectors need to be installed on the appropriate level of your home.  Replace batteries at least once a year.  Skin Cancer- When out in the sun, cover up and use sunscreen SPF 15 or higher.  Violence- If anyone is threatening or hurting you, please tell your healthcare provider.    If you have lab work done today you will be contacted with your lab results within the next 2 weeks.  If you have not heard from [place] then please contact us. The fastest way to get your results is to register for My Chart.   IF you received an x-ray today, you will receive an invoice from [facility]. Please contact [facility] at [phone] with questions or concerns regarding your invoice.   IF you received labwork today, you will receive an invoice from [facility]. Please contact [facility] at [phone] with questions or  concerns regarding your invoice.   Our billing staff will not be able to assist you with questions regarding bills from these companies.  You will be contacted with the lab results as soon as they are available. The fastest way to get your results is to activate your My Chart account. Instructions are located on the last page of this paperwork. If you have not heard from [place] regarding the results in  2 weeks, please contact this office.

## 2019-10-02 NOTE — Progress Notes (Signed)
Subjective:  Patient ID: Zachary Carey, male    DOB: [birth year]  Age: 44 y.o. MRN: [id number]  CC:  Chief Complaint  Patient presents with  . Annual Exam    pt did not bring his glasses, could not complete the vision screening    HPI Zachary Carey presents for Annual exam.   BP normal at home. Under 140/90.  Repeat 142/90 today.   Hyperlipidemia: Not on meds. Low ascvd score prior.  Exercising frequently.  No fast food. The 10-year ASCVD risk score Zachary Carey DC Zachary Carey., et al., 2013) is: 2.5%   Values used to calculate the score:     Age: 8 years     Sex: Male     Is Non-Hispanic African American: No     Diabetic: No     Tobacco smoker: No     Systolic Blood Pressure: 389 mmHg     Is BP treated: No     HDL Cholesterol: 49 mg/dL     Total Cholesterol: 199 mg/dL  Lab Results  Component Value Date   CHOL 199 02/27/2019   HDL 49 02/27/2019   LDLCALC 126 (H) 02/27/2019   TRIG 137 02/27/2019   CHOLHDL 4.1 02/27/2019   Lab Results  Component Value Date   ALT 141 (H) 02/27/2019   AST 72 (H) 02/27/2019   ALKPHOS 48 02/27/2019   BILITOT 0.4 02/27/2019   Cancer screening: No immediate family members with Cancer.   Immunization History  Administered Date(s) Administered  . Tdap 07/06/2015  covid 19 vaccine: had 1st vaccine few days ago.   Depression screen [facility] 2/9 02/27/2019 09/24/2017 09/19/2016 07/06/2015 01/19/2015  Decreased Interest 0 0 0 0 0  Down, Depressed, Hopeless 0 0 0 0 0  PHQ - 2 Score 0 0 0 0 0     Hearing Screening   125Hz  250Hz  500Hz  1000Hz  2000Hz  3000Hz  4000Hz  6000Hz  8000Hz   Right ear:           Left ear:             Visual Acuity Screening   Right eye Left eye Both eyes  Without correction:   20/15  With correction:     Comments: Pt did  not bring glassed to complete the screening wears glasses.  Last optho appt - about 2 years ago.   Dental: 1-2 years ago.   STI screening:declines tests.   Exercise: daily.running. at least 30 minutes.    History There are no problems to display for this patient.  No past medical history on file. No past surgical history on file. No Known Allergies Prior to Admission medications   Not on File   Social History   Socioeconomic History  . Marital status: Married    Spouse name: Not on file  . Number of children: Not on file  . Years of education: Not on file  . Highest education level: Not on file  Occupational History  . Not on file  Tobacco Use  . Smoking status: Never Smoker  . Smokeless tobacco: Never Used  Substance and Sexual Activity  . Alcohol use: No  . Drug use: No  . Sexual activity: Yes  Other Topics Concern  . Not on file  Social History Narrative  . Not on file   Social Determinants of Health   Financial Resource Strain:   . Difficulty of Paying Living Expenses:   Food Insecurity:   . Worried About Charity fundraiser in the Last Year:   .  Ran Out of Food in the Last Year:   Transportation Needs:   . Film/video editor (Medical):   Marland Kitchen Lack of Transportation (Non-Medical):   Physical Activity:   . Days of Exercise per Week:   . Minutes of Exercise per Session:   Stress:   . Feeling of Stress :   Social Connections:   . Frequency of Communication with Friends and Family:   . Frequency of Social Gatherings with Friends and Family:   . Attends Religious Services:   . Active Member of Clubs or Organizations:   . Attends Archivist Meetings:   Marland Kitchen Marital Status:   Intimate Partner Violence:   . Fear of Current or Ex-Partner:   . Emotionally Abused:   Marland Kitchen Physically Abused:   . Sexually Abused:     Review of Systems 13 point review of systems per patient health survey noted.  Negative other than as indicated above or in HPI.    Objective:   Vitals:   10/02/19 1445  BP: (!) 142/90  Pulse: 73  Temp: 98.7 F (37.1 C)  SpO2: 98%  Weight: 155 lb (70.3 kg)  Height: 5' 5"  (1.651 m)     Physical Exam Vitals reviewed.   Constitutional:      Appearance: He is well-developed.  HENT:     Head: Normocephalic and atraumatic.     Right Ear: External ear normal.     Left Ear: External ear normal.  Eyes:     Conjunctiva/sclera: Conjunctivae normal.     Pupils: Pupils are equal, round, and reactive to light.  Neck:     Thyroid: No thyromegaly.  Cardiovascular:     Rate and Rhythm: Normal rate and regular rhythm.     Heart sounds: Normal heart sounds.  Pulmonary:     Effort: Pulmonary effort is normal. No respiratory distress.     Breath sounds: Normal breath sounds. No wheezing.  Abdominal:     General: There is no distension.     Palpations: Abdomen is soft.     Tenderness: There is no abdominal tenderness.  Musculoskeletal:        General: No tenderness. Normal range of motion.     Cervical back: Normal range of motion and neck supple.  Lymphadenopathy:     Cervical: No cervical adenopathy.  Skin:    General: Skin is warm and dry.  Neurological:     Mental Status: He is alert and oriented to person, place, and time.     Deep Tendon Reflexes: Reflexes are normal and symmetric.  Psychiatric:        Behavior: Behavior normal.      Assessment & Plan:  Zachary Carey is a 44 y.o. male . Annual physical exam  - -anticipatory guidance as below in AVS, screening labs above. Health maintenance items as above in HPI discussed/recommended as applicable.   -Follow-up recommended with ophthalmology, dentist.  Screening for metabolic disorder - Plan: CMP14+EGFR  Hyperlipidemia, unspecified hyperlipidemia type - Plan: Lipid panel  -Continue diet/exercise approach, check lipids but previous low ASCVD risk score.  Screening for thyroid disorder - Plan: TSH   No orders of the defined types were placed in this encounter.  Patient Instructions   I do recommend follow up with dentist and eye specialist.  Thank you for coming in today and take care.  Keeping you healthy  Get these tests  Blood  pressure- Have your blood pressure checked once a year by your healthcare provider.  Normal  blood pressure is 120/80.  Weight- Have your body mass index (BMI) calculated to screen for obesity.  BMI is a measure of body fat based on height and weight. You can also calculate your own BMI at [employer].  Cholesterol- Have your cholesterol checked regularly starting at age 18, sooner may be necessary if you have diabetes, high blood pressure, if a family member developed heart diseases at an early age or if you smoke.   Chlamydia, HIV, and other sexual transmitted disease- Get screened each year until the age of 27 then within three months of each new sexual partner.  Diabetes- Have your blood sugar checked regularly if you have high blood pressure, high cholesterol, a family history of diabetes or if you are overweight.  Get these vaccines  Flu shot- Every fall.  Tetanus shot- Every 10 years.  Menactra- Single dose; prevents meningitis.  Take these steps  Don't smoke- If you do smoke, ask your healthcare provider about quitting. For tips on how to quit, go to www.smokefree.gov or call 1-800-QUIT-NOW.  Be physically active- Exercise 5 days a week for at least 30 minutes.  If you are not already physically active start slow and gradually work up to 30 minutes of moderate physical activity.  Examples of moderate activity include walking briskly, mowing the yard, dancing, swimming bicycling, etc.  Eat a healthy diet- Eat a variety of healthy foods such as fruits, vegetables, low fat milk, low fat cheese, yogurt, lean meats, poultry, fish, beans, tofu, etc.  For more information on healthy eating, go to www.thenutritionsource.org  Drink alcohol in moderation- Limit alcohol intake two drinks or less a day.  Never drink and drive.  Dentist- Brush and floss teeth twice daily; visit your dentis twice a year.  Depression-Your emotional health is as important as your physical health.   If you're feeling down, losing interest in things you normally enjoy please talk with your healthcare provider.  Gun Safety- If you keep a gun in your home, keep it unloaded and with the safety lock on.  Bullets should be stored separately.  Helmet use- Always wear a helmet when riding a motorcycle, bicycle, rollerblading or skateboarding.  Safe sex- If you may be exposed to a sexually transmitted infection, use a condom  Seat belts- Seat bels can save your life; always wear one.  Smoke/Carbon Monoxide detectors- These detectors need to be installed on the appropriate level of your home.  Replace batteries at least once a year.  Skin Cancer- When out in the sun, cover up and use sunscreen SPF 15 or higher.  Violence- If anyone is threatening or hurting you, please tell your healthcare provider.    If you have lab work done today you will be contacted with your lab results within the next 2 weeks.  If you have not heard from [place] then please contact us. The fastest way to get your results is to register for My Chart.   IF you received an x-ray today, you will receive an invoice from [facility]. Please contact [facility] at [phone] with questions or concerns regarding your invoice.   IF you received labwork today, you will receive an invoice from [facility]. Please contact [facility] at [phone] with questions or concerns regarding your invoice.   Our billing staff will not be able to assist you with questions regarding bills from these companies.  You will be contacted with the lab results as soon as they are available. The fastest way to get your results  is to activate your My Chart account. Instructions are located on the last page of this paperwork. If you have not heard from [place] regarding the results in 2 weeks, please contact this office.         Signed, Merri Ray, MD [facility]

## 2019-10-03 ENCOUNTER — Encounter: Payer: Self-pay | Admitting: Family Medicine

## 2019-10-03 LAB — LIPID PANEL
Chol/HDL Ratio: 4.7 ratio (ref 0.0–5.0)
Cholesterol, Total: 225 mg/dL — ABNORMAL HIGH (ref 100–199)
HDL: 48 mg/dL (ref >39)
LDL Chol Calc (NIH): 144 mg/dL — ABNORMAL HIGH (ref 0–99)
Triglycerides: 185 mg/dL — ABNORMAL HIGH (ref 0–149)
VLDL Cholesterol Cal: 33 mg/dL (ref 5–40)

## 2019-10-03 LAB — CMP14+EGFR
ALT: 39 IU/L (ref 0–44)
AST: 32 IU/L (ref 0–40)
Albumin/Globulin Ratio: 1.6 (ref 1.2–2.2)
Albumin: 4.7 g/dL (ref 4.0–5.0)
Alkaline Phosphatase: 51 IU/L (ref 39–117)
BUN/Creatinine Ratio: 15 (ref 9–20)
BUN: 14 mg/dL (ref 6–24)
Bilirubin Total: 0.3 mg/dL (ref 0.0–1.2)
CO2: 25 mmol/L (ref 20–29)
Calcium: 9.5 mg/dL (ref 8.7–10.2)
Chloride: 103 mmol/L (ref 96–106)
Creatinine, Ser: 0.92 mg/dL (ref 0.76–1.27)
GFR calc Af Amer: 116 mL/min/{1.73_m2} (ref >59)
GFR calc non Af Amer: 100 mL/min/{1.73_m2} (ref >59)
Globulin, Total: 2.9 g/dL (ref 1.5–4.5)
Glucose: 85 mg/dL (ref 65–99)
Potassium: 4.2 mmol/L (ref 3.5–5.2)
Sodium: 141 mmol/L (ref 134–144)
Total Protein: 7.6 g/dL (ref 6.0–8.5)

## 2019-10-03 LAB — TSH: TSH: 1.43 u[IU]/mL (ref 0.450–4.500)

## 2019-10-13 NOTE — Progress Notes (Signed)
Letter sent.
